# Patient Record
Sex: Female | Born: 1980 | Race: White | Hispanic: No | Marital: Married | Smoking: Current every day smoker
Health system: Southern US, Community
[De-identification: ages and names within clinical notes are randomized; demographics above are authoritative.]

## PROBLEM LIST (undated history)

## (undated) DIAGNOSIS — Z720 Tobacco use: Secondary | ICD-10-CM

## (undated) DIAGNOSIS — I7100 Dissection of unspecified site of aorta: Secondary | ICD-10-CM

## (undated) DIAGNOSIS — I10 Essential (primary) hypertension: Secondary | ICD-10-CM

## (undated) DIAGNOSIS — Z9889 Other specified postprocedural states: Secondary | ICD-10-CM

## (undated) DIAGNOSIS — D649 Anemia, unspecified: Secondary | ICD-10-CM

---

## 2015-04-19 ENCOUNTER — Emergency Department (HOSPITAL_COMMUNITY): Payer: Medicaid Other

## 2015-04-19 ENCOUNTER — Encounter (HOSPITAL_COMMUNITY): Payer: Self-pay | Admitting: Emergency Medicine

## 2015-04-19 ENCOUNTER — Inpatient Hospital Stay (HOSPITAL_COMMUNITY)
Admission: EM | Admit: 2015-04-19 | Discharge: 2015-04-26 | DRG: 220 | Disposition: A | Discharge status: Home / Self Care | Payer: Medicaid Other | Attending: Thoracic Surgery (Cardiothoracic Vascular Surgery) | Admitting: Thoracic Surgery (Cardiothoracic Vascular Surgery)

## 2015-04-19 DIAGNOSIS — R791 Abnormal coagulation profile: Secondary | ICD-10-CM

## 2015-04-19 DIAGNOSIS — R7989 Other specified abnormal findings of blood chemistry: Secondary | ICD-10-CM | POA: Insufficient documentation

## 2015-04-19 DIAGNOSIS — D6959 Other secondary thrombocytopenia: Secondary | ICD-10-CM | POA: Diagnosis present

## 2015-04-19 DIAGNOSIS — I252 Old myocardial infarction: Secondary | ICD-10-CM

## 2015-04-19 DIAGNOSIS — D62 Acute posthemorrhagic anemia: Secondary | ICD-10-CM | POA: Diagnosis not present

## 2015-04-19 DIAGNOSIS — Z720 Tobacco use: Secondary | ICD-10-CM | POA: Diagnosis present

## 2015-04-19 DIAGNOSIS — I712 Thoracic aortic aneurysm, without rupture: Secondary | ICD-10-CM | POA: Diagnosis present

## 2015-04-19 DIAGNOSIS — I351 Nonrheumatic aortic (valve) insufficiency: Secondary | ICD-10-CM | POA: Diagnosis present

## 2015-04-19 DIAGNOSIS — K089 Disorder of teeth and supporting structures, unspecified: Secondary | ICD-10-CM

## 2015-04-19 DIAGNOSIS — E8770 Fluid overload, unspecified: Secondary | ICD-10-CM | POA: Diagnosis not present

## 2015-04-19 DIAGNOSIS — I214 Non-ST elevation (NSTEMI) myocardial infarction: Secondary | ICD-10-CM | POA: Diagnosis present

## 2015-04-19 DIAGNOSIS — I313 Pericardial effusion (noninflammatory): Secondary | ICD-10-CM | POA: Diagnosis present

## 2015-04-19 DIAGNOSIS — J9589 Other postprocedural complications and disorders of respiratory system, not elsewhere classified: Secondary | ICD-10-CM | POA: Diagnosis not present

## 2015-04-19 DIAGNOSIS — F17210 Nicotine dependence, cigarettes, uncomplicated: Secondary | ICD-10-CM | POA: Diagnosis present

## 2015-04-19 DIAGNOSIS — Z8249 Family history of ischemic heart disease and other diseases of the circulatory system: Secondary | ICD-10-CM

## 2015-04-19 DIAGNOSIS — I7121 Aneurysm of the ascending aorta, without rupture: Secondary | ICD-10-CM | POA: Diagnosis present

## 2015-04-19 DIAGNOSIS — Z23 Encounter for immunization: Secondary | ICD-10-CM

## 2015-04-19 DIAGNOSIS — I7101 Dissection of thoracic aorta: Principal | ICD-10-CM | POA: Diagnosis present

## 2015-04-19 DIAGNOSIS — R42 Dizziness and giddiness: Secondary | ICD-10-CM | POA: Diagnosis not present

## 2015-04-19 DIAGNOSIS — K029 Dental caries, unspecified: Secondary | ICD-10-CM | POA: Diagnosis present

## 2015-04-19 DIAGNOSIS — K0530 Chronic periodontitis, unspecified: Secondary | ICD-10-CM | POA: Diagnosis present

## 2015-04-19 DIAGNOSIS — J9811 Atelectasis: Secondary | ICD-10-CM

## 2015-04-19 DIAGNOSIS — I10 Essential (primary) hypertension: Secondary | ICD-10-CM | POA: Diagnosis present

## 2015-04-19 DIAGNOSIS — I7100 Dissection of unspecified site of aorta: Secondary | ICD-10-CM

## 2015-04-19 DIAGNOSIS — R079 Chest pain, unspecified: Secondary | ICD-10-CM | POA: Diagnosis present

## 2015-04-19 DIAGNOSIS — D649 Anemia, unspecified: Secondary | ICD-10-CM | POA: Diagnosis present

## 2015-04-19 DIAGNOSIS — Z9889 Other specified postprocedural states: Secondary | ICD-10-CM

## 2015-04-19 HISTORY — DX: Tobacco use: Z72.0

## 2015-04-19 HISTORY — DX: Other specified postprocedural states: Z98.890

## 2015-04-19 HISTORY — DX: Dissection of unspecified site of aorta: I71.00

## 2015-04-19 HISTORY — DX: Essential (primary) hypertension: I10

## 2015-04-19 HISTORY — DX: Anemia, unspecified: D64.9

## 2015-04-19 LAB — BASIC METABOLIC PANEL
ANION GAP: 8 (ref 5–15)
BUN: 14 mg/dL (ref 6–20)
CALCIUM: 8.2 mg/dL — AB (ref 8.9–10.3)
CO2: 20 mmol/L — ABNORMAL LOW (ref 22–32)
Chloride: 113 mmol/L — ABNORMAL HIGH (ref 101–111)
Creatinine, Ser: 0.83 mg/dL (ref 0.44–1.00)
GFR calc Af Amer: >60 mL/min (ref >60)
GLUCOSE: 93 mg/dL (ref 65–99)
POTASSIUM: 4.3 mmol/L (ref 3.5–5.1)
SODIUM: 141 mmol/L (ref 135–145)

## 2015-04-19 LAB — TROPONIN I: TROPONIN I: 0.05 ng/mL — AB (ref <0.031)

## 2015-04-19 LAB — I-STAT BETA HCG BLOOD, ED (MC, WL, AP ONLY): I-stat hCG, quantitative: <5 m[IU]/mL (ref <5)

## 2015-04-19 LAB — CBC
HEMATOCRIT: 35.9 % — AB (ref 36.0–46.0)
HEMOGLOBIN: 11.6 g/dL — AB (ref 12.0–15.0)
MCH: 29.7 pg (ref 26.0–34.0)
MCHC: 32.3 g/dL (ref 30.0–36.0)
MCV: 92.1 fL (ref 78.0–100.0)
Platelets: 209 10*3/uL (ref 150–400)
RBC: 3.9 MIL/uL (ref 3.87–5.11)
RDW: 13.1 % (ref 11.5–15.5)
WBC: 11.1 10*3/uL — AB (ref 4.0–10.5)

## 2015-04-19 LAB — PROTIME-INR
INR: 1.14 (ref 0.00–1.49)
Prothrombin Time: 14.8 seconds (ref 11.6–15.2)

## 2015-04-19 LAB — I-STAT TROPONIN, ED: TROPONIN I, POC: 0 ng/mL (ref 0.00–0.08)

## 2015-04-19 LAB — APTT: APTT: 31 s (ref 24–37)

## 2015-04-19 LAB — D-DIMER, QUANTITATIVE (NOT AT ARMC): D DIMER QUANT: 6.35 ug{FEU}/mL — AB (ref 0.00–0.50)

## 2015-04-19 MED ORDER — IOHEXOL 350 MG/ML SOLN
100.0000 mL | Freq: Once | INTRAVENOUS | Status: AC | PRN
  Administered 2015-04-19: 100 mL via INTRAVENOUS

## 2015-04-19 MED ORDER — MORPHINE SULFATE (PF) 4 MG/ML IV SOLN
6.0000 mg | Freq: Once | INTRAVENOUS | Status: AC
  Administered 2015-04-19: 6 mg via INTRAVENOUS
  Filled 2015-04-19: qty 2

## 2015-04-19 MED ORDER — ONDANSETRON HCL 4 MG/2ML IJ SOLN
INTRAMUSCULAR | Status: AC
  Filled 2015-04-19: qty 2

## 2015-04-19 MED ORDER — ATORVASTATIN CALCIUM 40 MG PO TABS
40.0000 mg | ORAL_TABLET | Freq: Every day | ORAL | Status: DC
  Administered 2015-04-19: 40 mg via ORAL
  Filled 2015-04-19: qty 1

## 2015-04-19 MED ORDER — ASPIRIN EC 325 MG PO TBEC: 325.0000 mg | DELAYED_RELEASE_TABLET | Freq: Every day | ORAL | Status: DC

## 2015-04-19 MED ORDER — HEPARIN BOLUS VIA INFUSION
3500.0000 [IU] | Freq: Once | INTRAVENOUS | Status: AC
  Administered 2015-04-19: 3500 [IU] via INTRAVENOUS
  Filled 2015-04-19: qty 3500

## 2015-04-19 MED ORDER — LORAZEPAM 2 MG/ML IJ SOLN
0.5000 mg | Freq: Once | INTRAMUSCULAR | Status: AC
  Administered 2015-04-19: 0.5 mg via INTRAVENOUS
  Filled 2015-04-19: qty 1

## 2015-04-19 MED ORDER — HYDROMORPHONE HCL 1 MG/ML IJ SOLN
1.0000 mg | Freq: Once | INTRAMUSCULAR | Status: AC
  Administered 2015-04-19: 1 mg via INTRAVENOUS
  Filled 2015-04-19: qty 1

## 2015-04-19 MED ORDER — HEPARIN (PORCINE) IN NACL 100-0.45 UNIT/ML-% IJ SOLN
850.0000 [IU]/h | INTRAMUSCULAR | Status: DC
  Administered 2015-04-19: 700 [IU]/h via INTRAVENOUS
  Filled 2015-04-19: qty 250

## 2015-04-19 MED ORDER — ACETAMINOPHEN 325 MG PO TABS: 650.0000 mg | ORAL_TABLET | Freq: Four times a day (QID) | ORAL | Status: DC | PRN

## 2015-04-19 MED ORDER — ALUM & MAG HYDROXIDE-SIMETH 200-200-20 MG/5ML PO SUSP: 30.0000 mL | Freq: Four times a day (QID) | ORAL | Status: DC | PRN

## 2015-04-19 MED ORDER — NICOTINE 14 MG/24HR TD PT24
14.0000 mg | MEDICATED_PATCH | Freq: Every day | TRANSDERMAL | Status: DC
  Administered 2015-04-19: 14 mg via TRANSDERMAL
  Filled 2015-04-19: qty 1

## 2015-04-19 MED ORDER — SODIUM CHLORIDE 0.9 % IV SOLN: INTRAVENOUS | Status: DC

## 2015-04-19 MED ORDER — SODIUM CHLORIDE 0.9% FLUSH: 3.0000 mL | Freq: Two times a day (BID) | INTRAVENOUS | Status: DC

## 2015-04-19 MED ORDER — MORPHINE SULFATE (PF) 2 MG/ML IV SOLN
2.0000 mg | INTRAVENOUS | Status: DC | PRN
  Administered 2015-04-19: 2 mg via INTRAVENOUS
  Filled 2015-04-19: qty 1

## 2015-04-19 MED ORDER — HEPARIN SODIUM (PORCINE) 5000 UNIT/ML IJ SOLN: 5000.0000 [IU] | Freq: Three times a day (TID) | INTRAMUSCULAR | Status: DC

## 2015-04-19 MED ORDER — MORPHINE SULFATE (PF) 4 MG/ML IV SOLN
4.0000 mg | Freq: Once | INTRAVENOUS | Status: AC
  Administered 2015-04-19: 4 mg via INTRAVENOUS
  Filled 2015-04-19: qty 1

## 2015-04-19 MED ORDER — ONDANSETRON HCL 4 MG/2ML IJ SOLN
4.0000 mg | Freq: Once | INTRAMUSCULAR | Status: AC
Start: 2015-04-19 — End: 2015-04-19
  Administered 2015-04-19: 4 mg via INTRAVENOUS

## 2015-04-19 MED ORDER — ACETAMINOPHEN 650 MG RE SUPP: 650.0000 mg | Freq: Four times a day (QID) | RECTAL | Status: DC | PRN

## 2015-04-19 MED ORDER — LORAZEPAM 2 MG/ML IJ SOLN
1.0000 mg | Freq: Once | INTRAMUSCULAR | Status: AC
  Administered 2015-04-19: 1 mg via INTRAVENOUS

## 2015-04-19 NOTE — ED Notes (Signed)
Family at bedside. 

## 2015-04-19 NOTE — ED Notes (Signed)
Patient transported to CT 

## 2015-04-19 NOTE — ED Provider Notes (Signed)
CSN: [id number]     Arrival date & time 04/19/15  1437 History   First MD Initiated Contact with Patient 04/19/15 1503     Chief Complaint  Patient presents with  . Chest Pain     (Consider location/radiation/quality/duration/timing/severity/associated sxs/prior Treatment) Patient is a 35 y.o. female presenting with chest pain. The history is provided by the patient. The history is limited by a language barrier. A language interpreter was used ([place] transplator via telephone).  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 Immediate sharp chest pain, diaphor Relieved by:  Nothing Worsened by:  Deep breathing and certain positions Ineffective treatments:  None tried Associated symptoms: anxiety, cough, diaphoresis, nausea, shortness of breath and vomiting   Associated symptoms: no abdominal pain, no back pain, no dizziness, no dysphagia, no fatigue, no fever, no headache, no palpitations and no weakness     Past Medical History  Diagnosis Date  . Tobacco abuse    History reviewed. No pertinent past surgical history. Family History  Problem Relation Age of Onset  . Aortic aneurysm Mother   . Coronary artery disease Mother   . Thyroid disease Mother   . Varicose Veins Mother    Social History  Substance Use Topics  . Smoking status: Current Every Day Smoker -- 0.10 packs/day for 20 years    Types: Cigarettes  . Smokeless tobacco: None  . Alcohol Use: No   OB History    No data available     Review of Systems  Constitutional: Positive for diaphoresis. Negative for fever, chills, activity change, appetite change and fatigue.  HENT: Negative for facial  swelling, rhinorrhea, sore throat, trouble swallowing and voice change.   Eyes: Negative for photophobia, pain and visual disturbance.  Respiratory: Positive for cough, chest tightness and shortness of breath. Negative for apnea, choking, wheezing and stridor.   Cardiovascular: Positive for chest pain. Negative for palpitations and leg swelling.  Gastrointestinal: Positive for nausea and vomiting. Negative for abdominal pain, constipation and anal bleeding.  Endocrine: Negative.   Genitourinary: Negative for dysuria, vaginal bleeding, vaginal discharge and vaginal pain.  Musculoskeletal: Negative for myalgias, back pain and arthralgias.  Skin: Negative.  Negative for rash.  Allergic/Immunologic: Negative.   Neurological: Negative for dizziness, tremors, syncope, weakness and headaches.  Psychiatric/Behavioral: Negative for suicidal ideas, sleep disturbance and self-injury.  All other systems reviewed and are negative.     Allergies  Review of patient's allergies indicates no known allergies.  Home Medications   Prior to Admission medications   Medication Sig Start Date End Date Taking? Authorizing Provider  acetaminophen (TYLENOL) 500 MG tablet Take 500 mg by mouth every 6 (six) hours as needed for mild pain.   Yes Historical Provider, MD   BP 189/64 mmHg  Pulse 74  Temp(Src) 98.9 F (37.2 C) (Oral)  Resp 11  Ht 5\' 6"  (1.676 m)  Wt 59.421 kg  BMI 21.15 kg/m2  SpO2 100% Physical Exam  Constitutional: She is oriented to person, place, and time. She appears well-developed and well-nourished. No distress.  HENT:  Head: Normocephalic and atraumatic.  Right Ear: External ear normal.  Left Ear: External ear normal.  Mouth/Throat: Oropharynx  is clear and moist. No oropharyngeal exudate.  Eyes: Conjunctivae and EOM are normal. Pupils are equal, round, and reactive to light. No scleral icterus.  Neck: Normal range of motion. Neck supple. No JVD present. No tracheal deviation present.  No thyromegaly present.  Cardiovascular: Intact distal pulses.  Exam reveals no gallop and no friction rub.   No murmur heard. Regular rhythm iin high 50's during my exam  Pulmonary/Chest: Effort normal and breath sounds normal. No respiratory distress. She has no wheezes. She has no rales.  No stridor or respiratory distress on exam. Speaking in paragraphs. Handling oral secretions well.   Right lateral breast with palpable lump. No tenderness. No induration, fluctuance, or warmth to suggest acute infection.   Abdominal: Soft. Bowel sounds are normal. She exhibits no distension. There is no tenderness.  Musculoskeletal: Normal range of motion. She exhibits no edema or tenderness.  Neurological: She is alert and oriented to person, place, and time. No cranial nerve deficit. She exhibits normal muscle tone. Coordination normal.  Skin: Skin is warm and dry. She is not diaphoretic. No pallor.  Psychiatric: She has a normal mood and affect. She expresses no homicidal and no suicidal ideation. She expresses no suicidal plans and no homicidal plans.  Nursing note and vitals reviewed.   ED Course  Procedures (including critical care time) Labs Review Labs Reviewed  BASIC METABOLIC PANEL - Abnormal; Notable for the following:    Chloride 113 (*)    CO2 20 (*)    Calcium 8.2 (*)    All other components within normal limits  CBC - Abnormal; Notable for the following:    WBC 11.1 (*)    Hemoglobin 11.6 (*)    HCT 35.9 (*)    All other components within normal limits  D-DIMER, QUANTITATIVE (NOT AT [facility]) - Abnormal; Notable for the following:    D-Dimer, Quant 6.35 (*)    All other components within normal limits  TROPONIN I - Abnormal; Notable for the following:    Troponin I 0.05 (*)    All other components within normal limits  MRSA PCR SCREENING  PROTIME-INR  APTT  TROPONIN I  URINE RAPID DRUG SCREEN, HOSP PERFORMED  TROPONIN I  TROPONIN I  HEMOGLOBIN A1C  LIPID PANEL   COMPREHENSIVE METABOLIC PANEL  CBC  HEPARIN LEVEL (UNFRACTIONATED)  I-STAT TROPOININ, ED  I-STAT BETA HCG BLOOD, ED ([facility], [facility], [facility] ONLY)    Imaging Review Dg Chest 2 View  04/19/2015  CLINICAL DATA:  Shortness of breath, chest pain/tightness, nausea, blurred vision today. Recent flu-like symptoms. EXAM: CHEST  2 VIEW COMPARISON:  None. FINDINGS: Given the slight patient rotation, cardiomediastinal silhouette appears within normal limits in size and configuration. Heart size is normal. Lungs are clear. No pleural effusion. Osseous structures about the chest are unremarkable. IMPRESSION: Lungs are clear and there is no evidence of acute cardiopulmonary abnormality. No evidence of pneumonia. Electronically Signed   By: Bary Richard M.D.   On: 04/19/2015 15:23   Ct Angio Chest Pe W/cm &/or Wo Cm  04/19/2015  CLINICAL DATA:  Shortness of breath and chest pain EXAM: CT ANGIOGRAPHY CHEST WITH CONTRAST TECHNIQUE: Multidetector CT imaging of the chest was performed using the standard protocol during bolus administration of intravenous contrast. Multiplanar CT image reconstructions and MIPs were obtained to evaluate the vascular anatomy. CONTRAST:  OMNIPAQUE IOHEXOL 350 MG/ML SOLN COMPARISON:  Chest radiograph April 19, 2015 FINDINGS: Mediastinum/Lymph Nodes: There is no demonstrable pulmonary embolus. There is dilatation  of the proximal ascending thoracic aorta with a transverse diameter of 5.2 x 4.8 cm. The visualized great vessels appear unremarkable. Pericardium is not appreciably thickened. Thyroid appears unremarkable. There is no demonstrable thoracic adenopathy. Lungs/Pleura: There is slight scarring in the right lung base region. There are a few scattered small bullae in the upper lobes. There is upper and lower lobe bronchiectatic change. There is no parenchymal lung edema or consolidation. Upper abdomen: There is reflux of contrast into the aorta and hepatic veins. The visualized upper abdominal  structures otherwise appear unremarkable. Musculoskeletal: There are no blastic or lytic bone lesions. There is asymmetric breast tissue in the lateral right breast compared to the left. Review of the MIP images confirms the above findings. IMPRESSION: No demonstrable pulmonary embolus. There is dilatation of the ascending thoracic aorta with a transverse diameter of 5.2 x 4.8 cm. Ascending thoracic aortic aneurysm. Recommend semi-annual imaging followup by CTA or MRA and referral to cardiothoracic surgery if not already obtained. This recommendation follows 2010 ACCF/AHA/AATS/ACR/ASA/SCA/SCAI/SIR/STS/SVM Guidelines for the Diagnosis and Management of Patients With Thoracic Aortic Disease. Circulation. 2010; 121: Z610-R604. Suspect aortic stenosis given this degree of proximal ascending thoracic aortic dilatation. No edema or consolidation. Mild upper and lower lobe bronchiectatic change bilaterally. No adenopathy apparent. Reflux of contrast into the inferior vena cava and hepatic veins. Suspect a degree of increased right heart pressure. Asymmetric breast tissue lateral right breast compared to the left. This finding warrants physical examination. Consider breast ultrasound if palpable abnormality is noted in the lateral right breast. Electronically Signed   By: Bretta Bang III M.D.   On: 04/19/2015 18:39   I have personally reviewed and evaluated these images and lab results as part of my medical decision-making.   EKG Interpretation   Date/Time:  Friday April 19 2015 14:42:56 EDT Ventricular Rate:  58 PR Interval:  181 QRS Duration: 100 QT Interval:  458 QTC Calculation: 450 R Axis:   97 Text Interpretation:  Right and left arm electrode reversal,  interpretation assumes no reversal Sinus rhythm Probable lateral infarct,  old Confirmed by Ranae Palms  MD, DAVID ([id number]) on 04/19/2015 3:05:14 PM      MDM   Final diagnoses:  D-dimer, elevated    The patient is a 35 year old female who  denies any past medical history who presents via EMS with sudden onset chest pain after the patient accidentally inhaled Clorox while cleaning. EMS reports patient was hypotensive on their arrival however after getting fluids patient has blood pressure of 106/54 on arrival to the emergency department. EKG shows sinus rhythm. X-ray does not show any acute pathology. Troponin was drawn at 0 and 3 hours are both within normal limits. D-dimer significantly elevated and patient has pleuritic chest pain therefore CT chest is obtained. While this does not show a pulmonary embolism it does show a dilated thoracic aorta with no signs of acute dissection. An abnormality of the lateral right breast is also noted on the CT scan which is confirmed on physical exam. The patient is made aware of this as is the admitting team as there is a likely need for ultrasound follow-up. The patient has continued pain requiring multiple doses of IV pain medications. Given her abnormal CTA chest and continued pain she is admitted to the hospitalist service for further monitoring and evaluation. Cardiac thoracic surgeries also called and consult on the patient. Patient family expressed understanding and agreement with this plan.  Patient seen with attending, Dr. Ranae Palms, who oversaw clinical decision  making.   Lula Olszewski, MD 04/20/15 1610  Loren Racer, MD 04/24/15 [phone]
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
## 2015-04-19 NOTE — ED Notes (Signed)
Dr Fayrene FearingXiu updated on pt's BP and persistent CP

## 2015-04-19 NOTE — ED Notes (Signed)
Dr Fayrene FearingXiu updated on pt's trop

## 2015-04-19 NOTE — ED Notes (Signed)
Patient transported to X-ray 

## 2015-04-19 NOTE — ED Notes (Signed)
Pt and husband updated via translation line on D-dimer result and pending CTA

## 2015-04-19 NOTE — Progress Notes (Signed)
ANTICOAGULATION CONSULT NOTE - Initial Consult  Pharmacy Consult for Heparin Indication: chest pain/ACS  No Known Allergies  Patient Measurements: Height: 5' 6.93" (170 cm) Weight: 130 lb (58.968 kg) IBW/kg (Calculated) : 61.44 Heparin Dosing Weight: 59 kg  Vital Signs: Temp: 97.4 F (36.3 C) (03/17 1448) Temp Source: Oral (03/17 1448) BP: 164/63 mmHg (03/17 2115) Pulse Rate: 73 (03/17 2115)  Labs:  Recent Labs  04/19/15 1456  HGB 11.6*  HCT 35.9*  PLT 209  APTT 31  LABPROT 14.8  INR 1.14  CREATININE 0.83  TROPONINI 0.05*    CrCl cannot be calculated (Unknown ideal weight.).   Medical History: Past Medical History  Diagnosis Date  . Tobacco abuse     Medications:   (Not in a hospital admission) Scheduled:  . aspirin EC  325 mg Oral Daily  . atorvastatin  40 mg Oral q1800  . heparin  5,000 Units Subcutaneous 3 times per day  . nicotine  14 mg Transdermal Daily  . sodium chloride flush  3 mL Intravenous Q12H   Infusions:    Assessment: 35yo female with history of tobacco abuse presents with CP. Pharmacy is consulted to dose heparin for ACS/chest pain.  Goal of Therapy:  Heparin level 0.3-0.7 units/ml Monitor platelets by anticoagulation protocol: Yes   Plan:  Give 3500 units bolus x 1 Start heparin infusion at 700 units/hr Check anti-Xa level in 6 hours and daily while on heparin Continue to monitor H&H and platelets  Arlean Hoppingorey M. Newman PiesBall, PharmD, BCPS Clinical Pharmacist Pager [phone] 04/19/2015,10:04 PM

## 2015-04-19 NOTE — ED Notes (Signed)
Pt and family updated by Dr Ellin MayhewGoble via translation line

## 2015-04-19 NOTE — ED Notes (Signed)
From work for sub-sternal CP, initial BP 68/P, SB, with nausea and diaphoresis, non-English speaking 800ml NS  4mg  Zofran 324 ASA

## 2015-04-20 ENCOUNTER — Encounter (HOSPITAL_COMMUNITY): Payer: Self-pay | Admitting: Anesthesiology

## 2015-04-20 ENCOUNTER — Encounter (HOSPITAL_COMMUNITY): Payer: Self-pay | Admitting: *Deleted

## 2015-04-20 ENCOUNTER — Observation Stay (HOSPITAL_COMMUNITY): Payer: Medicaid Other | Admitting: Certified Registered Nurse Anesthetist

## 2015-04-20 ENCOUNTER — Inpatient Hospital Stay (HOSPITAL_COMMUNITY): Payer: Medicaid Other

## 2015-04-20 ENCOUNTER — Observation Stay (HOSPITAL_COMMUNITY): Payer: Medicaid Other

## 2015-04-20 ENCOUNTER — Encounter (HOSPITAL_COMMUNITY)
Admission: EM | Disposition: A | Discharge status: Home / Self Care | Payer: Self-pay | Source: Home / Self Care | Attending: Thoracic Surgery (Cardiothoracic Vascular Surgery)

## 2015-04-20 ENCOUNTER — Observation Stay (HOSPITAL_COMMUNITY): Payer: Self-pay

## 2015-04-20 DIAGNOSIS — E8770 Fluid overload, unspecified: Secondary | ICD-10-CM | POA: Diagnosis not present

## 2015-04-20 DIAGNOSIS — D62 Acute posthemorrhagic anemia: Secondary | ICD-10-CM | POA: Diagnosis not present

## 2015-04-20 DIAGNOSIS — I10 Essential (primary) hypertension: Secondary | ICD-10-CM

## 2015-04-20 DIAGNOSIS — I252 Old myocardial infarction: Secondary | ICD-10-CM | POA: Diagnosis not present

## 2015-04-20 DIAGNOSIS — R079 Chest pain, unspecified: Secondary | ICD-10-CM

## 2015-04-20 DIAGNOSIS — J9589 Other postprocedural complications and disorders of respiratory system, not elsewhere classified: Secondary | ICD-10-CM | POA: Diagnosis not present

## 2015-04-20 DIAGNOSIS — D649 Anemia, unspecified: Secondary | ICD-10-CM | POA: Diagnosis present

## 2015-04-20 DIAGNOSIS — Z8249 Family history of ischemic heart disease and other diseases of the circulatory system: Secondary | ICD-10-CM | POA: Diagnosis not present

## 2015-04-20 DIAGNOSIS — I712 Thoracic aortic aneurysm, without rupture: Secondary | ICD-10-CM

## 2015-04-20 DIAGNOSIS — R42 Dizziness and giddiness: Secondary | ICD-10-CM | POA: Diagnosis not present

## 2015-04-20 DIAGNOSIS — F17210 Nicotine dependence, cigarettes, uncomplicated: Secondary | ICD-10-CM | POA: Diagnosis present

## 2015-04-20 DIAGNOSIS — K029 Dental caries, unspecified: Secondary | ICD-10-CM | POA: Diagnosis present

## 2015-04-20 DIAGNOSIS — J9811 Atelectasis: Secondary | ICD-10-CM | POA: Diagnosis not present

## 2015-04-20 DIAGNOSIS — Z23 Encounter for immunization: Secondary | ICD-10-CM | POA: Diagnosis not present

## 2015-04-20 DIAGNOSIS — I351 Nonrheumatic aortic (valve) insufficiency: Secondary | ICD-10-CM | POA: Diagnosis present

## 2015-04-20 DIAGNOSIS — I7100 Dissection of unspecified site of aorta: Secondary | ICD-10-CM

## 2015-04-20 DIAGNOSIS — R791 Abnormal coagulation profile: Secondary | ICD-10-CM | POA: Diagnosis present

## 2015-04-20 DIAGNOSIS — I313 Pericardial effusion (noninflammatory): Secondary | ICD-10-CM | POA: Diagnosis present

## 2015-04-20 DIAGNOSIS — K0530 Chronic periodontitis, unspecified: Secondary | ICD-10-CM | POA: Diagnosis present

## 2015-04-20 DIAGNOSIS — Z720 Tobacco use: Secondary | ICD-10-CM

## 2015-04-20 DIAGNOSIS — I214 Non-ST elevation (NSTEMI) myocardial infarction: Secondary | ICD-10-CM

## 2015-04-20 DIAGNOSIS — Z9889 Other specified postprocedural states: Secondary | ICD-10-CM

## 2015-04-20 DIAGNOSIS — D6959 Other secondary thrombocytopenia: Secondary | ICD-10-CM | POA: Diagnosis present

## 2015-04-20 DIAGNOSIS — I719 Aortic aneurysm of unspecified site, without rupture: Secondary | ICD-10-CM

## 2015-04-20 DIAGNOSIS — I7101 Dissection of thoracic aorta: Secondary | ICD-10-CM | POA: Diagnosis present

## 2015-04-20 HISTORY — DX: Aortic aneurysm of unspecified site, without rupture: I71.9

## 2015-04-20 HISTORY — DX: Dissection of unspecified site of aorta: I71.00

## 2015-04-20 HISTORY — DX: Other specified postprocedural states: Z98.890

## 2015-04-20 HISTORY — PX: THORACIC AORTIC ANEURYSM REPAIR: SHX799

## 2015-04-20 HISTORY — PX: ASCENDING AORTIC ROOT REPLACEMENT: SHX5729

## 2015-04-20 LAB — CBC
HCT: 32.4 % — ABNORMAL LOW (ref 36.0–46.0)
HEMATOCRIT: 33.9 % — AB (ref 36.0–46.0)
HEMOGLOBIN: 10.9 g/dL — AB (ref 12.0–15.0)
Hemoglobin: 10.9 g/dL — ABNORMAL LOW (ref 12.0–15.0)
MCH: 29.7 pg (ref 26.0–34.0)
MCH: 30.8 pg (ref 26.0–34.0)
MCHC: 32.2 g/dL (ref 30.0–36.0)
MCHC: 33.6 g/dL (ref 30.0–36.0)
MCV: 92.4 fL (ref 78.0–100.0)
MCV: 91.5 fL (ref 78.0–100.0)
PLATELETS: 103 10*3/uL — AB (ref 150–400)
Platelets: 210 10*3/uL (ref 150–400)
RBC: 3.67 MIL/uL — ABNORMAL LOW (ref 3.87–5.11)
RBC: 3.54 MIL/uL — AB (ref 3.87–5.11)
RDW: 13.1 % (ref 11.5–15.5)
RDW: 13.7 % (ref 11.5–15.5)
WBC: 13.2 10*3/uL — ABNORMAL HIGH (ref 4.0–10.5)
WBC: 18.8 10*3/uL — ABNORMAL HIGH (ref 4.0–10.5)

## 2015-04-20 LAB — POCT I-STAT 3, ART BLOOD GAS (G3+)
ACID-BASE DEFICIT: 4 mmol/L — AB (ref 0.0–2.0)
ACID-BASE DEFICIT: 2 mmol/L (ref 0.0–2.0)
Acid-base deficit: 3 mmol/L — ABNORMAL HIGH (ref 0.0–2.0)
Acid-base deficit: 3 mmol/L — ABNORMAL HIGH (ref 0.0–2.0)
BICARBONATE: 21.6 meq/L (ref 20.0–24.0)
BICARBONATE: 23.4 meq/L (ref 20.0–24.0)
Bicarbonate: 21.9 mEq/L (ref 20.0–24.0)
Bicarbonate: 26.4 mEq/L — ABNORMAL HIGH (ref 20.0–24.0)
O2 SAT: 100 %
O2 SAT: 93 %
O2 SAT: 98 %
O2 Saturation: 100 %
PCO2 ART: 47.5 mmHg — AB (ref 35.0–45.0)
PH ART: 7.353 (ref 7.350–7.450)
PH ART: 7.238 — AB (ref 7.350–7.450)
PO2 ART: 79 mmHg — AB (ref 80.0–100.0)
PO2 ART: 120 mmHg — AB (ref 80.0–100.0)
Patient temperature: 36.4
TCO2: 23 mmol/L (ref 0–100)
TCO2: 23 mmol/L (ref 0–100)
TCO2: 28 mmol/L (ref 0–100)
TCO2: 25 mmol/L (ref 0–100)
pCO2 arterial: 39.5 mmHg (ref 35.0–45.0)
pCO2 arterial: 43.8 mmHg (ref 35.0–45.0)
pCO2 arterial: 61.5 mmHg (ref 35.0–45.0)
pH, Arterial: 7.302 — ABNORMAL LOW (ref 7.350–7.450)
pH, Arterial: 7.297 — ABNORMAL LOW (ref 7.350–7.450)
pO2, Arterial: 318 mmHg — ABNORMAL HIGH (ref 80.0–100.0)
pO2, Arterial: 418 mmHg — ABNORMAL HIGH (ref 80.0–100.0)

## 2015-04-20 LAB — POCT I-STAT, CHEM 8
BUN: 12 mg/dL (ref 6–20)
BUN: 10 mg/dL (ref 6–20)
BUN: 9 mg/dL (ref 6–20)
BUN: 10 mg/dL (ref 6–20)
BUN: 10 mg/dL (ref 6–20)
BUN: 11 mg/dL (ref 6–20)
BUN: 10 mg/dL (ref 6–20)
CALCIUM ION: 1.02 mmol/L — AB (ref 1.12–1.23)
CALCIUM ION: 1.03 mmol/L — AB (ref 1.12–1.23)
CALCIUM ION: 0.97 mmol/L — AB (ref 1.12–1.23)
CALCIUM ION: 1.01 mmol/L — AB (ref 1.12–1.23)
CREATININE: 0.2 mg/dL — AB (ref 0.44–1.00)
CREATININE: 0.3 mg/dL — AB (ref 0.44–1.00)
Calcium, Ion: 1.21 mmol/L (ref 1.12–1.23)
Calcium, Ion: 1.09 mmol/L — ABNORMAL LOW (ref 1.12–1.23)
Calcium, Ion: 1.24 mmol/L — ABNORMAL HIGH (ref 1.12–1.23)
Chloride: 107 mmol/L (ref 101–111)
Chloride: 105 mmol/L (ref 101–111)
Chloride: 106 mmol/L (ref 101–111)
Chloride: 99 mmol/L — ABNORMAL LOW (ref 101–111)
Chloride: 104 mmol/L (ref 101–111)
Chloride: 106 mmol/L (ref 101–111)
Chloride: 104 mmol/L (ref 101–111)
Creatinine, Ser: 0.4 mg/dL — ABNORMAL LOW (ref 0.44–1.00)
Creatinine, Ser: 0.3 mg/dL — ABNORMAL LOW (ref 0.44–1.00)
Creatinine, Ser: 0.3 mg/dL — ABNORMAL LOW (ref 0.44–1.00)
Creatinine, Ser: 0.4 mg/dL — ABNORMAL LOW (ref 0.44–1.00)
Creatinine, Ser: 0.3 mg/dL — ABNORMAL LOW (ref 0.44–1.00)
GLUCOSE: 141 mg/dL — AB (ref 65–99)
GLUCOSE: 200 mg/dL — AB (ref 65–99)
GLUCOSE: 216 mg/dL — AB (ref 65–99)
Glucose, Bld: 150 mg/dL — ABNORMAL HIGH (ref 65–99)
Glucose, Bld: 141 mg/dL — ABNORMAL HIGH (ref 65–99)
Glucose, Bld: 215 mg/dL — ABNORMAL HIGH (ref 65–99)
Glucose, Bld: 183 mg/dL — ABNORMAL HIGH (ref 65–99)
HCT: 29 % — ABNORMAL LOW (ref 36.0–46.0)
HCT: 24 % — ABNORMAL LOW (ref 36.0–46.0)
HCT: 27 % — ABNORMAL LOW (ref 36.0–46.0)
HCT: 22 % — ABNORMAL LOW (ref 36.0–46.0)
HCT: 24 % — ABNORMAL LOW (ref 36.0–46.0)
HEMATOCRIT: 20 % — AB (ref 36.0–46.0)
HEMATOCRIT: 28 % — AB (ref 36.0–46.0)
HEMOGLOBIN: 8.2 g/dL — AB (ref 12.0–15.0)
HEMOGLOBIN: 6.8 g/dL — AB (ref 12.0–15.0)
HEMOGLOBIN: 7.5 g/dL — AB (ref 12.0–15.0)
HEMOGLOBIN: 8.2 g/dL — AB (ref 12.0–15.0)
HEMOGLOBIN: 9.5 g/dL — AB (ref 12.0–15.0)
Hemoglobin: 9.9 g/dL — ABNORMAL LOW (ref 12.0–15.0)
Hemoglobin: 9.2 g/dL — ABNORMAL LOW (ref 12.0–15.0)
POTASSIUM: 5 mmol/L (ref 3.5–5.1)
POTASSIUM: 3.8 mmol/L (ref 3.5–5.1)
Potassium: 3.6 mmol/L (ref 3.5–5.1)
Potassium: 4.1 mmol/L (ref 3.5–5.1)
Potassium: 4.3 mmol/L (ref 3.5–5.1)
Potassium: 5.6 mmol/L — ABNORMAL HIGH (ref 3.5–5.1)
Potassium: 3.8 mmol/L (ref 3.5–5.1)
SODIUM: 136 mmol/L (ref 135–145)
SODIUM: 142 mmol/L (ref 135–145)
Sodium: 140 mmol/L (ref 135–145)
Sodium: 139 mmol/L (ref 135–145)
Sodium: 140 mmol/L (ref 135–145)
Sodium: 131 mmol/L — ABNORMAL LOW (ref 135–145)
Sodium: 137 mmol/L (ref 135–145)
TCO2: 21 mmol/L (ref 0–100)
TCO2: 22 mmol/L (ref 0–100)
TCO2: 23 mmol/L (ref 0–100)
TCO2: 21 mmol/L (ref 0–100)
TCO2: 23 mmol/L (ref 0–100)
TCO2: 24 mmol/L (ref 0–100)
TCO2: 24 mmol/L (ref 0–100)

## 2015-04-20 LAB — APTT: aPTT: 37 seconds (ref 24–37)

## 2015-04-20 LAB — COMPREHENSIVE METABOLIC PANEL
ALBUMIN: 2.9 g/dL — AB (ref 3.5–5.0)
ALT: 17 U/L (ref 14–54)
ANION GAP: 11 (ref 5–15)
AST: 17 U/L (ref 15–41)
Alkaline Phosphatase: 44 U/L (ref 38–126)
BUN: 13 mg/dL (ref 6–20)
CO2: 17 mmol/L — AB (ref 22–32)
Calcium: 8.5 mg/dL — ABNORMAL LOW (ref 8.9–10.3)
Chloride: 113 mmol/L — ABNORMAL HIGH (ref 101–111)
Creatinine, Ser: 0.61 mg/dL (ref 0.44–1.00)
GFR calc Af Amer: >60 mL/min (ref >60)
GFR calc non Af Amer: >60 mL/min (ref >60)
GLUCOSE: 147 mg/dL — AB (ref 65–99)
POTASSIUM: 3.9 mmol/L (ref 3.5–5.1)
SODIUM: 141 mmol/L (ref 135–145)
Total Bilirubin: 0.8 mg/dL (ref 0.3–1.2)
Total Protein: 5.3 g/dL — ABNORMAL LOW (ref 6.5–8.1)

## 2015-04-20 LAB — HEMOGLOBIN AND HEMATOCRIT, BLOOD
HEMATOCRIT: 25 % — AB (ref 36.0–46.0)
HEMOGLOBIN: 8.3 g/dL — AB (ref 12.0–15.0)

## 2015-04-20 LAB — GLUCOSE, CAPILLARY
GLUCOSE-CAPILLARY: 155 mg/dL — AB (ref 65–99)
GLUCOSE-CAPILLARY: 60 mg/dL — AB (ref 65–99)

## 2015-04-20 LAB — LIPID PANEL
CHOL/HDL RATIO: 3.1 ratio
Cholesterol: 136 mg/dL (ref 0–200)
HDL: 44 mg/dL (ref >40)
LDL Cholesterol: 85 mg/dL (ref 0–99)
Triglycerides: 37 mg/dL (ref <150)
VLDL: 7 mg/dL (ref 0–40)

## 2015-04-20 LAB — SURGICAL PCR SCREEN
MRSA, PCR: NEGATIVE
Staphylococcus aureus: NEGATIVE

## 2015-04-20 LAB — TROPONIN I
TROPONIN I: 0.03 ng/mL (ref <0.031)
Troponin I: 0.03 ng/mL (ref <0.031)

## 2015-04-20 LAB — POCT I-STAT 4, (NA,K, GLUC, HGB,HCT)
Glucose, Bld: 104 mg/dL — ABNORMAL HIGH (ref 65–99)
HCT: 33 % — ABNORMAL LOW (ref 36.0–46.0)
HEMOGLOBIN: 11.2 g/dL — AB (ref 12.0–15.0)
POTASSIUM: 4.1 mmol/L (ref 3.5–5.1)
SODIUM: 146 mmol/L — AB (ref 135–145)

## 2015-04-20 LAB — MRSA PCR SCREENING: MRSA BY PCR: NEGATIVE

## 2015-04-20 LAB — ABO/RH: ABO/RH(D): A POS

## 2015-04-20 LAB — HEPARIN LEVEL (UNFRACTIONATED): Heparin Unfractionated: 0.27 IU/mL — ABNORMAL LOW (ref 0.30–0.70)

## 2015-04-20 LAB — PROTIME-INR
INR: 1.59 — ABNORMAL HIGH (ref 0.00–1.49)
Prothrombin Time: 19 seconds — ABNORMAL HIGH (ref 11.6–15.2)

## 2015-04-20 LAB — FIBRINOGEN: Fibrinogen: 183 mg/dL — ABNORMAL LOW (ref 204–475)

## 2015-04-20 LAB — PLATELET COUNT: Platelets: 133 K/uL — ABNORMAL LOW (ref 150–400)

## 2015-04-20 LAB — PREPARE RBC (CROSSMATCH)

## 2015-04-20 SURGERY — ASCENDING AORTIC ROOT REPLACEMENT
Anesthesia: General | Site: Chest

## 2015-04-20 SURGERY — REPAIR, AORTIC DISSECTION, ASCENDING
Anesthesia: General

## 2015-04-20 MED ORDER — LACTATED RINGERS IV SOLN
INTRAVENOUS | Status: DC
  Administered 2015-04-20: 18:00:00 via INTRAVENOUS

## 2015-04-20 MED ORDER — CHLORHEXIDINE GLUCONATE 0.12% ORAL RINSE (MEDLINE KIT)
15.0000 mL | Freq: Two times a day (BID) | OROMUCOSAL | Status: DC
  Administered 2015-04-20 – 2015-04-22 (×3): 15 mL via OROMUCOSAL

## 2015-04-20 MED ORDER — SODIUM CHLORIDE 0.45 % IV SOLN
INTRAVENOUS | Status: DC | PRN
  Administered 2015-04-20: 18:00:00 via INTRAVENOUS

## 2015-04-20 MED ORDER — INSULIN ASPART 100 UNIT/ML ~~LOC~~ SOLN
0.0000 [IU] | SUBCUTANEOUS | Status: DC
  Administered 2015-04-21 – 2015-04-22 (×4): 2 [IU] via SUBCUTANEOUS

## 2015-04-20 MED ORDER — DOCUSATE SODIUM 100 MG PO CAPS
200.0000 mg | ORAL_CAPSULE | Freq: Every day | ORAL | Status: DC
  Administered 2015-04-21 – 2015-04-24 (×3): 200 mg via ORAL
  Filled 2015-04-20 (×6): qty 2

## 2015-04-20 MED ORDER — CHLORHEXIDINE GLUCONATE 0.12 % MT SOLN
15.0000 mL | OROMUCOSAL | Status: AC
  Administered 2015-04-20: 15 mL via OROMUCOSAL

## 2015-04-20 MED ORDER — ALBUMIN HUMAN 5 % IV SOLN
250.0000 mL | INTRAVENOUS | Status: AC | PRN
  Administered 2015-04-20 – 2015-04-21 (×4): 250 mL via INTRAVENOUS
  Filled 2015-04-20 (×2): qty 250

## 2015-04-20 MED ORDER — ACETAMINOPHEN 160 MG/5ML PO SOLN
1000.0000 mg | Freq: Four times a day (QID) | ORAL | Status: DC
  Administered 2015-04-21: 1000 mg
  Filled 2015-04-20: qty 40.6

## 2015-04-20 MED ORDER — MIDAZOLAM HCL 2 MG/2ML IJ SOLN
2.0000 mg | INTRAMUSCULAR | Status: DC | PRN
  Administered 2015-04-20: 2 mg via INTRAVENOUS
  Filled 2015-04-20: qty 2

## 2015-04-20 MED ORDER — VANCOMYCIN HCL 1000 MG IV SOLR
INTRAVENOUS | Status: AC
  Administered 2015-04-20: 1000 mL
  Filled 2015-04-20: qty 1000

## 2015-04-20 MED ORDER — ANTISEPTIC ORAL RINSE SOLUTION (CORINZ)
7.0000 mL | Freq: Four times a day (QID) | OROMUCOSAL | Status: DC
  Administered 2015-04-21 – 2015-04-23 (×6): 7 mL via OROMUCOSAL

## 2015-04-20 MED ORDER — ALBUMIN HUMAN 5 % IV SOLN
INTRAVENOUS | Status: DC | PRN
  Administered 2015-04-20 (×2): via INTRAVENOUS

## 2015-04-20 MED ORDER — ASPIRIN 81 MG PO CHEW: 324.0000 mg | CHEWABLE_TABLET | Freq: Every day | ORAL | Status: DC

## 2015-04-20 MED ORDER — MAGNESIUM SULFATE 4 GM/100ML IV SOLN
4.0000 g | Freq: Once | INTRAVENOUS | Status: AC
  Administered 2015-04-20: 4 g via INTRAVENOUS
  Filled 2015-04-20: qty 100

## 2015-04-20 MED ORDER — MIDAZOLAM HCL 5 MG/5ML IJ SOLN
INTRAMUSCULAR | Status: DC | PRN
  Administered 2015-04-20 (×2): 3 mg via INTRAVENOUS
  Administered 2015-04-20 (×2): 2 mg via INTRAVENOUS

## 2015-04-20 MED ORDER — VANCOMYCIN HCL 10 G IV SOLR
1250.0000 mg | INTRAVENOUS | Status: AC
Start: 2015-04-20 — End: 2015-04-20
  Administered 2015-04-20: 1250 mg via INTRAVENOUS
  Filled 2015-04-20: qty 1250

## 2015-04-20 MED ORDER — NITROGLYCERIN IN D5W 200-5 MCG/ML-% IV SOLN
2.0000 ug/min | INTRAVENOUS | Status: AC
  Administered 2015-04-20: 5 ug/min via INTRAVENOUS
  Filled 2015-04-20: qty 250

## 2015-04-20 MED ORDER — INSULIN REGULAR BOLUS VIA INFUSION
0.0000 [IU] | Freq: Three times a day (TID) | INTRAVENOUS | Status: DC
  Filled 2015-04-20: qty 10

## 2015-04-20 MED ORDER — METOPROLOL TARTRATE 25 MG/10 ML ORAL SUSPENSION: 12.5000 mg | Freq: Two times a day (BID) | ORAL | Status: DC

## 2015-04-20 MED ORDER — PHENYLEPHRINE HCL 10 MG/ML IJ SOLN
0.0000 ug/min | INTRAVENOUS | Status: DC
  Filled 2015-04-20: qty 2

## 2015-04-20 MED ORDER — HEPARIN SODIUM (PORCINE) 1000 UNIT/ML IJ SOLN
INTRAMUSCULAR | Status: DC | PRN
  Administered 2015-04-20: 20000 [IU] via INTRAVENOUS

## 2015-04-20 MED ORDER — SODIUM CHLORIDE 0.9 % IV SOLN
INTRAVENOUS | Status: DC
  Administered 2015-04-20: 18:00:00 via INTRAVENOUS

## 2015-04-20 MED ORDER — SODIUM CHLORIDE 0.9 % IV SOLN
INTRAVENOUS | Status: AC
  Administered 2015-04-20: 1.8 [IU]/h via INTRAVENOUS
  Filled 2015-04-20: qty 2.5

## 2015-04-20 MED ORDER — HEPARIN SODIUM (PORCINE) 1000 UNIT/ML IJ SOLN
INTRAMUSCULAR | Status: DC
  Filled 2015-04-20: qty 30

## 2015-04-20 MED ORDER — LACTATED RINGERS IV SOLN
INTRAVENOUS | Status: DC | PRN
  Administered 2015-04-20 (×2): via INTRAVENOUS

## 2015-04-20 MED ORDER — ROCURONIUM BROMIDE 100 MG/10ML IV SOLN
INTRAVENOUS | Status: DC | PRN
  Administered 2015-04-20: 50 mg via INTRAVENOUS

## 2015-04-20 MED ORDER — PHENYLEPHRINE HCL 10 MG/ML IJ SOLN
30.0000 ug/min | INTRAVENOUS | Status: DC
  Filled 2015-04-20: qty 2

## 2015-04-20 MED ORDER — SODIUM CHLORIDE 0.9 % IV SOLN: 250.0000 mL | INTRAVENOUS | Status: DC

## 2015-04-20 MED ORDER — ETOMIDATE 2 MG/ML IV SOLN
INTRAVENOUS | Status: DC | PRN
  Administered 2015-04-20: 20 mg via INTRAVENOUS

## 2015-04-20 MED ORDER — DEXTROSE 50 % IV SOLN
INTRAVENOUS | Status: AC
Start: 2015-04-20 — End: 2015-04-20
  Administered 2015-04-20: 16 mL
  Filled 2015-04-20: qty 50

## 2015-04-20 MED ORDER — POTASSIUM CHLORIDE 10 MEQ/50ML IV SOLN: 10.0000 meq | INTRAVENOUS | Status: AC

## 2015-04-20 MED ORDER — MIDAZOLAM HCL 10 MG/2ML IJ SOLN
INTRAMUSCULAR | Status: AC
  Filled 2015-04-20: qty 2

## 2015-04-20 MED ORDER — ONDANSETRON HCL 4 MG/2ML IJ SOLN
4.0000 mg | Freq: Four times a day (QID) | INTRAMUSCULAR | Status: DC | PRN
Start: 2015-04-20 — End: 2015-04-20
  Administered 2015-04-20: 4 mg via INTRAVENOUS

## 2015-04-20 MED ORDER — ACETAMINOPHEN 650 MG RE SUPP
650.0000 mg | Freq: Once | RECTAL | Status: AC
  Administered 2015-04-20: 650 mg via RECTAL

## 2015-04-20 MED ORDER — PROPOFOL 10 MG/ML IV BOLUS
INTRAVENOUS | Status: AC
  Filled 2015-04-20: qty 20

## 2015-04-20 MED ORDER — SODIUM CHLORIDE 0.9 % IV SOLN
INTRAVENOUS | Status: DC
  Filled 2015-04-20: qty 2.5

## 2015-04-20 MED ORDER — TRAMADOL HCL 50 MG PO TABS
50.0000 mg | ORAL_TABLET | ORAL | Status: DC | PRN
  Administered 2015-04-21: 50 mg via ORAL
  Administered 2015-04-23 – 2015-04-25 (×5): 100 mg via ORAL
  Filled 2015-04-20 (×5): qty 2
  Filled 2015-04-20: qty 1

## 2015-04-20 MED ORDER — OXYCODONE HCL 5 MG PO TABS
5.0000 mg | ORAL_TABLET | ORAL | Status: DC | PRN
  Administered 2015-04-21 (×3): 10 mg via ORAL
  Administered 2015-04-21 (×2): 5 mg via ORAL
  Administered 2015-04-22 – 2015-04-24 (×6): 10 mg via ORAL
  Filled 2015-04-20 (×3): qty 2
  Filled 2015-04-20: qty 1
  Filled 2015-04-20 (×5): qty 2
  Filled 2015-04-20: qty 1
  Filled 2015-04-20: qty 2

## 2015-04-20 MED ORDER — PLASMA-LYTE 148 IV SOLN
INTRAVENOUS | Status: AC
  Administered 2015-04-20: 500 mL
  Filled 2015-04-20: qty 2.5

## 2015-04-20 MED ORDER — HYDRALAZINE HCL 20 MG/ML IJ SOLN
5.0000 mg | INTRAMUSCULAR | Status: DC | PRN
  Administered 2015-04-20: 5 mg via INTRAVENOUS
  Filled 2015-04-20: qty 1

## 2015-04-20 MED ORDER — NITROGLYCERIN IN D5W 200-5 MCG/ML-% IV SOLN: 0.0000 ug/min | INTRAVENOUS | Status: DC

## 2015-04-20 MED ORDER — SODIUM CHLORIDE 0.9% FLUSH
3.0000 mL | Freq: Two times a day (BID) | INTRAVENOUS | Status: DC
  Administered 2015-04-21 – 2015-04-22 (×2): 3 mL via INTRAVENOUS

## 2015-04-20 MED ORDER — SODIUM CHLORIDE 0.9 % IV SOLN
Freq: Once | INTRAVENOUS | Status: AC
  Administered 2015-04-20: 20:00:00 via INTRAVENOUS

## 2015-04-20 MED ORDER — DEXTROSE 5 % IV SOLN
20.0000 mg | INTRAVENOUS | Status: DC | PRN
  Administered 2015-04-20: 15 ug/min via INTRAVENOUS

## 2015-04-20 MED ORDER — ONDANSETRON HCL 4 MG/2ML IJ SOLN
4.0000 mg | Freq: Four times a day (QID) | INTRAMUSCULAR | Status: DC | PRN
Start: 2015-04-20 — End: 2015-04-26
  Administered 2015-04-21 – 2015-04-23 (×5): 4 mg via INTRAVENOUS
  Filled 2015-04-20 (×5): qty 2

## 2015-04-20 MED ORDER — FENTANYL CITRATE (PF) 100 MCG/2ML IJ SOLN
INTRAMUSCULAR | Status: DC | PRN
  Administered 2015-04-20: 150 ug via INTRAVENOUS
  Administered 2015-04-20: 100 ug via INTRAVENOUS
  Administered 2015-04-20 (×2): 250 ug via INTRAVENOUS
  Administered 2015-04-20: 150 ug via INTRAVENOUS
  Administered 2015-04-20: 100 ug via INTRAVENOUS
  Administered 2015-04-20: 150 ug via INTRAVENOUS
  Administered 2015-04-20: 200 ug via INTRAVENOUS
  Administered 2015-04-20: 250 ug via INTRAVENOUS
  Administered 2015-04-20: 150 ug via INTRAVENOUS
  Administered 2015-04-20: 100 ug via INTRAVENOUS
  Administered 2015-04-20: 150 ug via INTRAVENOUS

## 2015-04-20 MED ORDER — PANTOPRAZOLE SODIUM 40 MG PO TBEC
40.0000 mg | DELAYED_RELEASE_TABLET | Freq: Every day | ORAL | Status: DC
  Administered 2015-04-22 – 2015-04-26 (×5): 40 mg via ORAL
  Filled 2015-04-20 (×7): qty 1

## 2015-04-20 MED ORDER — FENTANYL CITRATE (PF) 250 MCG/5ML IJ SOLN
INTRAMUSCULAR | Status: AC
  Filled 2015-04-20: qty 10

## 2015-04-20 MED ORDER — SODIUM CHLORIDE 0.9 % IV SOLN
INTRAVENOUS | Status: DC
  Filled 2015-04-20: qty 40

## 2015-04-20 MED ORDER — SODIUM CHLORIDE 0.9 % IV SOLN
INTRAVENOUS | Status: AC
  Administered 2015-04-20: 69.8 mL/h via INTRAVENOUS
  Filled 2015-04-20: qty 40

## 2015-04-20 MED ORDER — CHLORHEXIDINE GLUCONATE 0.12 % MT SOLN: 15.0000 mL | Freq: Once | OROMUCOSAL | Status: DC

## 2015-04-20 MED ORDER — BISACODYL 10 MG RE SUPP: 10.0000 mg | Freq: Every day | RECTAL | Status: DC

## 2015-04-20 MED ORDER — FENTANYL CITRATE (PF) 250 MCG/5ML IJ SOLN
INTRAMUSCULAR | Status: AC
  Filled 2015-04-20: qty 25

## 2015-04-20 MED ORDER — HEMOSTATIC AGENTS (NO CHARGE) OPTIME
TOPICAL | Status: DC | PRN
  Administered 2015-04-20: 1 via TOPICAL

## 2015-04-20 MED ORDER — SODIUM CHLORIDE 0.9% FLUSH: 3.0000 mL | INTRAVENOUS | Status: DC | PRN

## 2015-04-20 MED ORDER — SODIUM CHLORIDE 0.9 % IJ SOLN
INTRAMUSCULAR | Status: DC | PRN
  Administered 2015-04-20 (×5): 4 mL via TOPICAL

## 2015-04-20 MED ORDER — EPINEPHRINE HCL 1 MG/ML IJ SOLN
0.0000 ug/min | INTRAVENOUS | Status: DC
  Filled 2015-04-20: qty 4

## 2015-04-20 MED ORDER — ACETAMINOPHEN 500 MG PO TABS
1000.0000 mg | ORAL_TABLET | Freq: Four times a day (QID) | ORAL | Status: AC
  Administered 2015-04-21 – 2015-04-25 (×17): 1000 mg via ORAL
  Filled 2015-04-20 (×18): qty 2

## 2015-04-20 MED ORDER — POTASSIUM CHLORIDE 2 MEQ/ML IV SOLN
80.0000 meq | INTRAVENOUS | Status: DC
  Filled 2015-04-20: qty 40

## 2015-04-20 MED ORDER — DOPAMINE-DEXTROSE 3.2-5 MG/ML-% IV SOLN
0.0000 ug/kg/min | INTRAVENOUS | Status: DC
  Filled 2015-04-20: qty 250

## 2015-04-20 MED ORDER — MORPHINE SULFATE (PF) 2 MG/ML IV SOLN
2.0000 mg | INTRAVENOUS | Status: DC | PRN
  Administered 2015-04-21: 2 mg via INTRAVENOUS
  Filled 2015-04-20 (×2): qty 1

## 2015-04-20 MED ORDER — CHLORHEXIDINE GLUCONATE CLOTH 2 % EX PADS: 6.0000 | MEDICATED_PAD | Freq: Once | CUTANEOUS | Status: DC

## 2015-04-20 MED ORDER — PROTAMINE SULFATE 10 MG/ML IV SOLN
INTRAVENOUS | Status: DC | PRN
  Administered 2015-04-20: 10 mg via INTRAVENOUS
  Administered 2015-04-20: 190 mg via INTRAVENOUS

## 2015-04-20 MED ORDER — LIDOCAINE HCL (CARDIAC) 20 MG/ML IV SOLN
INTRAVENOUS | Status: DC | PRN
  Administered 2015-04-20 (×2): 50 mg via INTRAVENOUS

## 2015-04-20 MED ORDER — BISACODYL 5 MG PO TBEC
10.0000 mg | DELAYED_RELEASE_TABLET | Freq: Every day | ORAL | Status: DC
  Administered 2015-04-21 – 2015-04-24 (×3): 10 mg via ORAL
  Filled 2015-04-20 (×6): qty 2

## 2015-04-20 MED ORDER — ACETAMINOPHEN 160 MG/5ML PO SOLN: 650.0000 mg | Freq: Once | ORAL | Status: AC

## 2015-04-20 MED ORDER — LACTATED RINGERS IV SOLN
INTRAVENOUS | Status: DC | PRN
  Administered 2015-04-20 (×2): via INTRAVENOUS

## 2015-04-20 MED ORDER — PROPOFOL 10 MG/ML IV BOLUS
INTRAVENOUS | Status: DC | PRN
  Administered 2015-04-20 (×4): 100 mg via INTRAVENOUS
  Administered 2015-04-20: 200 mg via INTRAVENOUS

## 2015-04-20 MED ORDER — ASPIRIN EC 325 MG PO TBEC
325.0000 mg | DELAYED_RELEASE_TABLET | Freq: Every day | ORAL | Status: DC
  Administered 2015-04-21 – 2015-04-25 (×5): 325 mg via ORAL
  Filled 2015-04-20 (×5): qty 1

## 2015-04-20 MED ORDER — LACTATED RINGERS IV SOLN: 500.0000 mL | Freq: Once | INTRAVENOUS | Status: DC | PRN

## 2015-04-20 MED ORDER — INFLUENZA VAC SPLIT QUAD 0.5 ML IM SUSY: 0.5000 mL | PREFILLED_SYRINGE | INTRAMUSCULAR | Status: DC

## 2015-04-20 MED ORDER — CEFUROXIME SODIUM 750 MG IJ SOLR
750.0000 mg | INTRAMUSCULAR | Status: DC
  Filled 2015-04-20: qty 750

## 2015-04-20 MED ORDER — VECURONIUM BROMIDE 10 MG IV SOLR
INTRAVENOUS | Status: DC | PRN
Start: 2015-04-20 — End: 2015-04-20
  Administered 2015-04-20 (×5): 5 mg via INTRAVENOUS

## 2015-04-20 MED ORDER — DEXTROSE 5 % IV SOLN
1.5000 g | Freq: Two times a day (BID) | INTRAVENOUS | Status: AC
  Administered 2015-04-20 – 2015-04-22 (×4): 1.5 g via INTRAVENOUS
  Filled 2015-04-20 (×4): qty 1.5

## 2015-04-20 MED ORDER — MAGNESIUM SULFATE 50 % IJ SOLN
40.0000 meq | INTRAMUSCULAR | Status: DC
  Filled 2015-04-20: qty 10

## 2015-04-20 MED ORDER — METOPROLOL TARTRATE 12.5 MG HALF TABLET
12.5000 mg | ORAL_TABLET | Freq: Two times a day (BID) | ORAL | Status: DC
  Administered 2015-04-21: 12.5 mg via ORAL
  Filled 2015-04-20: qty 1

## 2015-04-20 MED ORDER — METOPROLOL TARTRATE 1 MG/ML IV SOLN
2.5000 mg | INTRAVENOUS | Status: DC | PRN
Start: 2015-04-20 — End: 2015-04-26

## 2015-04-20 MED ORDER — LACTATED RINGERS IV SOLN
INTRAVENOUS | Status: DC | PRN
  Administered 2015-04-20 (×2): via INTRAVENOUS

## 2015-04-20 MED ORDER — DEXTROSE 5 % IV SOLN
1.5000 g | INTRAVENOUS | Status: AC
  Administered 2015-04-20: 1.5 g via INTRAVENOUS
  Administered 2015-04-20: .75 g via INTRAVENOUS
  Filled 2015-04-20: qty 1.5

## 2015-04-20 MED ORDER — FENTANYL CITRATE (PF) 250 MCG/5ML IJ SOLN
INTRAMUSCULAR | Status: AC
  Filled 2015-04-20: qty 5

## 2015-04-20 MED ORDER — METHYLPREDNISOLONE SODIUM SUCC 125 MG IJ SOLR
INTRAMUSCULAR | Status: DC | PRN
  Administered 2015-04-20: 125 mg via INTRAVENOUS

## 2015-04-20 MED ORDER — 0.9 % SODIUM CHLORIDE (POUR BTL) OPTIME
TOPICAL | Status: DC | PRN
  Administered 2015-04-20: 5000 mL

## 2015-04-20 MED ORDER — MORPHINE SULFATE (PF) 2 MG/ML IV SOLN
1.0000 mg | INTRAVENOUS | Status: DC | PRN
  Administered 2015-04-20 – 2015-04-21 (×7): 2 mg via INTRAVENOUS
  Filled 2015-04-20 (×4): qty 1
  Filled 2015-04-20: qty 2
  Filled 2015-04-20: qty 1

## 2015-04-20 MED ORDER — FAMOTIDINE IN NACL 20-0.9 MG/50ML-% IV SOLN
20.0000 mg | Freq: Two times a day (BID) | INTRAVENOUS | Status: AC
  Administered 2015-04-20 – 2015-04-21 (×2): 20 mg via INTRAVENOUS
  Filled 2015-04-20: qty 50

## 2015-04-20 MED ORDER — IOHEXOL 350 MG/ML SOLN
80.0000 mL | Freq: Once | INTRAVENOUS | Status: AC | PRN
  Administered 2015-04-20: 100 mL via INTRAVENOUS

## 2015-04-20 MED ORDER — DEXMEDETOMIDINE HCL IN NACL 200 MCG/50ML IV SOLN
0.0000 ug/kg/h | INTRAVENOUS | Status: DC
  Administered 2015-04-20: 0.7 ug/kg/h via INTRAVENOUS
  Filled 2015-04-20: qty 50

## 2015-04-20 MED ORDER — DEXMEDETOMIDINE HCL IN NACL 400 MCG/100ML IV SOLN
0.1000 ug/kg/h | INTRAVENOUS | Status: AC
Start: 2015-04-20 — End: 2015-04-20
  Administered 2015-04-20: .3 ug/kg/h via INTRAVENOUS
  Filled 2015-04-20: qty 100

## 2015-04-20 MED ORDER — VANCOMYCIN HCL IN DEXTROSE 1-5 GM/200ML-% IV SOLN
1000.0000 mg | Freq: Once | INTRAVENOUS | Status: AC
  Administered 2015-04-20: 1000 mg via INTRAVENOUS
  Filled 2015-04-20: qty 200

## 2015-04-20 SURGICAL SUPPLY — 114 items
ADAPTER CARDIO PERF ANTE/RETRO (ADAPTER) ×3 IMPLANT
BAG DECANTER FOR FLEXI CONT (MISCELLANEOUS) ×3 IMPLANT
BENZOIN TINCTURE PRP APPL 2/3 (GAUZE/BANDAGES/DRESSINGS) ×3 IMPLANT
BLADE STERNUM SYSTEM 6 (BLADE) ×3 IMPLANT
BLADE SURG 11 STRL SS (BLADE) ×6 IMPLANT
BLADE SURG 15 STRL LF DISP TIS (BLADE) IMPLANT
BLADE SURG 15 STRL SS (BLADE)
BLADE SURG CLIPPER 3M 9600 (MISCELLANEOUS) ×3 IMPLANT
BLADE SURG ROTATE 9660 (MISCELLANEOUS) IMPLANT
BLOOD HAEMOCONCENTR 700 MIDI (MISCELLANEOUS) ×3 IMPLANT
CANISTER SUCTION 2500CC (MISCELLANEOUS) ×3 IMPLANT
CANNULA ARTERIAL 007325 (MISCELLANEOUS) IMPLANT
CANNULA ARTERIAL 18F 007308 (MISCELLANEOUS) IMPLANT
CANNULA ARTERIAL 20F L7309 (MISCELLANEOUS) IMPLANT
CANNULA ARTERIAL 24F 007311 (MISCELLANEOUS) IMPLANT
CANNULA GRAFT 8MMX50CM (Graft) ×3 IMPLANT
CANNULA GUNDRY RCSP 15FR (MISCELLANEOUS) ×6 IMPLANT
CANNULA SUMP PERICARDIAL (CANNULA) ×3 IMPLANT
CATH HEART VENT LEFT (CATHETERS) ×4 IMPLANT
CATH THORACIC 28FR (CATHETERS) IMPLANT
CATH THORACIC 36FR RT ANG (CATHETERS) IMPLANT
CAUTERY SURG HI TEMP FINE TIP (MISCELLANEOUS) ×3 IMPLANT
CLIP TI MEDIUM 6 (CLIP) IMPLANT
CLIP TI WIDE RED SMALL 24 (CLIP) IMPLANT
CLIP TI WIDE RED SMALL 6 (CLIP) IMPLANT
CONN 1/2X1/2X1/2  BEN (MISCELLANEOUS)
CONN 1/2X1/2X1/2 BEN (MISCELLANEOUS) IMPLANT
CONN ST 1/4X3/8  BEN (MISCELLANEOUS) ×2
CONN ST 1/4X3/8 BEN (MISCELLANEOUS) ×4 IMPLANT
CONN Y 3/8X3/8X3/8  BEN (MISCELLANEOUS)
CONN Y 3/8X3/8X3/8 BEN (MISCELLANEOUS) IMPLANT
CONT SPECI 4OZ STER CLIK (MISCELLANEOUS) ×3 IMPLANT
CRADLE DONUT ADULT HEAD (MISCELLANEOUS) IMPLANT
DERMABOND ADVANCED (GAUZE/BANDAGES/DRESSINGS) ×1
DERMABOND ADVANCED .7 DNX12 (GAUZE/BANDAGES/DRESSINGS) ×2 IMPLANT
DRAPE INCISE IOBAN 66X45 STRL (DRAPES) ×3 IMPLANT
DRSG AQUACEL AG ADV 3.5X14 (GAUZE/BANDAGES/DRESSINGS) ×3 IMPLANT
DRSG COVADERM 4X14 (GAUZE/BANDAGES/DRESSINGS) ×3 IMPLANT
ELECT REM PT RETURN 9FT ADLT (ELECTROSURGICAL) ×6
ELECTRODE REM PT RTRN 9FT ADLT (ELECTROSURGICAL) ×4 IMPLANT
FELT TEFLON 1X6 (MISCELLANEOUS) ×6 IMPLANT
GAUZE SPONGE 4X4 12PLY STRL (GAUZE/BANDAGES/DRESSINGS) ×6 IMPLANT
GLOVE BIO SURGEON STRL SZ 6 (GLOVE) IMPLANT
GLOVE BIO SURGEON STRL SZ 6.5 (GLOVE) ×30 IMPLANT
GLOVE BIO SURGEON STRL SZ7 (GLOVE) IMPLANT
GLOVE BIO SURGEON STRL SZ7.5 (GLOVE) IMPLANT
GLOVE BIOGEL PI IND STRL 6 (GLOVE) ×12 IMPLANT
GLOVE BIOGEL PI IND STRL 6.5 (GLOVE) ×20 IMPLANT
GLOVE BIOGEL PI INDICATOR 6 (GLOVE) ×6
GLOVE BIOGEL PI INDICATOR 6.5 (GLOVE) ×10
GOWN STRL REUS W/ TWL LRG LVL3 (GOWN DISPOSABLE) ×24 IMPLANT
GOWN STRL REUS W/TWL LRG LVL3 (GOWN DISPOSABLE) ×12
GRAFT GELWEAVE VALSALVA 26CM (Prosthesis & Implant Heart) ×3 IMPLANT
GRAFT WOVEN D/V 26DX30L (Vascular Products) ×3 IMPLANT
HANDLE STAPLE ENDO GIA SHORT (STAPLE) ×1
HEMOSTAT POWDER SURGIFOAM 1G (HEMOSTASIS) ×6 IMPLANT
INSERT FOGARTY SM (MISCELLANEOUS) IMPLANT
INSERT FOGARTY XLG (MISCELLANEOUS) ×3 IMPLANT
KIT BASIN OR (CUSTOM PROCEDURE TRAY) ×3 IMPLANT
KIT DRAINAGE VACCUM ASSIST (KITS) ×3 IMPLANT
KIT ROOM TURNOVER OR (KITS) ×3 IMPLANT
KIT SUCTION CATH 14FR (SUCTIONS) ×3 IMPLANT
LEAD PACING MYOCARDI (MISCELLANEOUS) ×3 IMPLANT
LINE VENT (MISCELLANEOUS) ×3 IMPLANT
LOOP VESSEL MAXI BLUE (MISCELLANEOUS) ×3 IMPLANT
LOOP VESSEL MINI RED (MISCELLANEOUS) ×3 IMPLANT
LOOP VESSEL SUPERMAXI WHITE (MISCELLANEOUS) ×6 IMPLANT
NEEDLE AORTIC AIR ASPIRATING (NEEDLE) IMPLANT
NS IRRIG 1000ML POUR BTL (IV SOLUTION) ×15 IMPLANT
PACK OPEN HEART (CUSTOM PROCEDURE TRAY) ×3 IMPLANT
PAD ARMBOARD 7.5X6 YLW CONV (MISCELLANEOUS) ×6 IMPLANT
PUNCH AORTIC ROTATE  4.5MM 8IN (MISCELLANEOUS) ×3 IMPLANT
RELOAD ENDO GIA 30 3.5 (STAPLE) ×3 IMPLANT
SEALANT SURG COSEAL 8ML (VASCULAR PRODUCTS) ×3 IMPLANT
SET CARDIOPLEGIA MPS 5001102 (MISCELLANEOUS) ×3 IMPLANT
SOLUTION ANTI FOG 6CC (MISCELLANEOUS) ×3 IMPLANT
SPONGE GAUZE 4X4 12PLY STER LF (GAUZE/BANDAGES/DRESSINGS) ×3 IMPLANT
SPONGE LAP 18X18 X RAY DECT (DISPOSABLE) IMPLANT
SPONGE LAP 4X18 X RAY DECT (DISPOSABLE) IMPLANT
STAPLER ENDO GIA 12MM SHORT (STAPLE) ×2 IMPLANT
SUT ETHIBON 2 0 V 52N 30 (SUTURE) ×6 IMPLANT
SUT MNCRL AB 3-0 PS2 18 (SUTURE) ×6 IMPLANT
SUT PDS AB 1 CTX 36 (SUTURE) ×6 IMPLANT
SUT PROLENE 3 0 RB 1 (SUTURE) IMPLANT
SUT PROLENE 3 0 SH DA (SUTURE) ×9 IMPLANT
SUT PROLENE 3 0 SH1 36 (SUTURE) ×21 IMPLANT
SUT PROLENE 4 0 RB 1 (SUTURE) ×13
SUT PROLENE 4 0 SH DA (SUTURE) ×6 IMPLANT
SUT PROLENE 4-0 RB1 .5 CRCL 36 (SUTURE) ×26 IMPLANT
SUT PROLENE 5 0 C 1 36 (SUTURE) ×24 IMPLANT
SUT PROLENE 6 0 C 1 30 (SUTURE) ×6 IMPLANT
SUT SILK 3 0 TIES 10X30 (SUTURE) ×3 IMPLANT
SUT STEEL STERNAL CCS#1 18IN (SUTURE) ×3 IMPLANT
SUT STEEL SZ 6 DBL 3X14 BALL (SUTURE) ×6 IMPLANT
SUT VIC AB 1 CTX 27 (SUTURE) IMPLANT
SUT VIC AB 1 CTX 36 (SUTURE) ×1
SUT VIC AB 1 CTX36XBRD ANBCTR (SUTURE) ×2 IMPLANT
SUT VIC AB 2-0 CTX 36 (SUTURE) ×9 IMPLANT
SUT VIC AB 3-0 SH 27 (SUTURE)
SUT VIC AB 3-0 SH 27X BRD (SUTURE) IMPLANT
SUT VIC AB 3-0 SH 8-18 (SUTURE) ×3 IMPLANT
SUT VIC AB 3-0 X1 27 (SUTURE) IMPLANT
SUT VICRYL 4-0 PS2 18IN ABS (SUTURE) IMPLANT
SYR 10ML KIT SKIN ADHESIVE (MISCELLANEOUS) ×3 IMPLANT
SYSTEM SAHARA CHEST DRAIN ATS (WOUND CARE) ×3 IMPLANT
SYSTEM SAHARA CHEST DRAIN RE-I (WOUND CARE) ×3 IMPLANT
TAPE CLOTH SURG 4X10 WHT LF (GAUZE/BANDAGES/DRESSINGS) ×3 IMPLANT
TAPE PAPER 3X10 WHT MICROPORE (GAUZE/BANDAGES/DRESSINGS) ×3 IMPLANT
TOWEL OR 17X24 6PK STRL BLUE (TOWEL DISPOSABLE) ×3 IMPLANT
TOWEL OR 17X26 10 PK STRL BLUE (TOWEL DISPOSABLE) ×6 IMPLANT
TRAY CATH LUMEN 1 20CM STRL (SET/KITS/TRAYS/PACK) IMPLANT
TUBE SUCT INTRACARD DLP 20F (MISCELLANEOUS) IMPLANT
VENT LEFT HEART 12002 (CATHETERS) ×6
WATER STERILE IRR 1000ML POUR (IV SOLUTION) ×6 IMPLANT

## 2015-04-20 NOTE — Progress Notes (Signed)
Consult request received by Dr. Isidoro Donningai for aortic dissection - patient has already been taken to the OR by Dr. Cornelius Moraswen. This seems to be a surgical case at this time. I'm happy to provide consultation for Dr. Cornelius Moraswen if requested. I will check back with the patient tomorrow morning.  Chrystie NoseKenneth C. Clydean Posas, MD, [facility]ACC Attending Cardiologist [facility]

## 2015-04-20 NOTE — Progress Notes (Signed)
CARDIOTHORACIC SURGERY BRIEF CONSULT NOTE   I was contacted by telephone last night and asked to see consult on this patient this morning because of dilated ascending aorta seen on CT angiogram.  I was explicitly told by ED provider that the patient's dilated aorta was felt to be unrelated to her presenting problem and there was no reason to see the patient in the ED last night.  I have just personally reviewed the CT angiogram performed last night and have concerns that the scan does not sufficiently rule out the possibility of aortic dissection involving the aortic root.  The contrast was timed for PE protocol and the aortic root is not well opacified.  Given the patient's clinical history I feel that the scan should be repeated STAT as a dedicated aortic protocol to r/o dissection.  This would best be done with a cardiac-gated scan to eliminate motion artifact, but a non-gated scan should be sufficient.  Will stop IV heparin until repeat CT angiogram has been done.  Patient will need an echocardiogram done at some point to r/o aortic valve pathology such as bicuspid aortic valve disease.  Full note to follow.  Purcell Nailslarence H Lashane Whelpley, MD 04/20/2015 7:25 AM

## 2015-04-20 NOTE — Progress Notes (Signed)
  Echocardiogram Echocardiogram Transesophageal has been performed.  Janalyn HarderWest, Petrina Melby R 04/20/2015, 10:41 AM

## 2015-04-20 NOTE — Progress Notes (Signed)
Shortly after patient switched to CPAP/PS increase in agitation noted. Heart rhythm went from NSR to a ventricular rhythm with a rate in the 100s-110s. Wean terminated, placed back on full support, and pain medication administered. Patient's father is at bedside, translating to patient, and explained to her why we are not continuing with wean. She nods that she understands. Will continue to assess for readiness to wean. Thresa RossA. Haggard RN

## 2015-04-20 NOTE — Anesthesia Postprocedure Evaluation (Signed)
Anesthesia Post Note  Patient: [name] BullaMelinda Tatum  Procedure(s) Performed: Procedure(s) (LRB): VALVE-SPARING AORTIC ROOT REPLACEMENT USING A 26MM GELWEAVE VALSALVA GRAFT AND REIMPLANTATION OF RIGHT AND LEFT CORONARY ARTERIES  (N/A) REPAIR OF AORTIC DISSECTION WITH A 26MM HEMASHIELD GRAFT  Patient location during evaluation: SICU Anesthesia Type: General Level of consciousness: sedated and patient remains intubated per anesthesia plan Pain management: pain level controlled Vital Signs Assessment: post-procedure vital signs reviewed and stable Respiratory status: patient remains intubated per anesthesia plan and patient on ventilator - see flowsheet for VS Cardiovascular status: blood pressure returned to baseline Anesthetic complications: no    Last Vitals:  Filed Vitals:   04/20/15 1830 04/20/15 1900  BP:    Pulse: 95   Temp: 36.2 C 36.3 C  Resp: 13     Last Pain:  Filed Vitals:   04/20/15 1902  PainSc: 6                  Henson Fraticelli A

## 2015-04-20 NOTE — Anesthesia Preprocedure Evaluation (Signed)
Anesthesia Evaluation  Patient identified by MRN, date of birth, ID band Patient awake  Preop documentation limited or incomplete due to emergent nature of procedure.  History of Anesthesia Complications Negative for: history of anesthetic complications  Airway Mallampati: I  TM Distance: >3 FB Neck ROM: Full    Dental  (+) Teeth Intact   Pulmonary Current Smoker,    breath sounds clear to auscultation       Cardiovascular + Past MI   Rhythm:Regular Rate:Normal     Neuro/Psych    GI/Hepatic   Endo/Other    Renal/GU      Musculoskeletal   Abdominal   Peds  Hematology   Anesthesia Other Findings Pt interviewed on OR bed prior to induction. Limited spoken [place].  Reproductive/Obstetrics                             Anesthesia Physical Anesthesia Plan  ASA: IV and emergent  Anesthesia Plan: General   Post-op Pain Management:    Induction: Intravenous  Airway Management Planned: Oral ETT  Additional Equipment: Arterial line, PA Cath, Ultrasound Guidance Line Placement and TEE  Intra-op Plan:   Post-operative Plan: Post-operative intubation/ventilation  Informed Consent: I have reviewed the patients History and Physical, chart, labs and discussed the procedure including the risks, benefits and alternatives for the proposed anesthesia with the patient or authorized representative who has indicated his/her understanding and acceptance.   History available from chart only and Only emergency history available  Plan Discussed with: CRNA, Anesthesiologist and Surgeon  Anesthesia Plan Comments:         Anesthesia Quick Evaluation

## 2015-04-20 NOTE — OR Nursing (Signed)
1st call made @1547  to SICU  2nd call made @1608  to SICU 3rd call made @1643  to SICU, confirming transition to Room 15 postoperatively. 4th call made @1716 , informed of transfer.

## 2015-04-20 NOTE — Progress Notes (Signed)
[facility]                                                                              Patient Demographics  [name] [name], is a 35 y.o. female, DOB - Nov 16, 1980, WUJ:[id number]  Admit date - 04/19/2015   Admitting Physician Lorretta Harp, MD  Outpatient Primary MD for the patient is No PCP Per Patient  LOS -   days    Chief Complaint  Patient presents with  . Chest Pain       Brief HPI   Patient is a 35 year old female with history of smoking presented with severe chest pain last night. Per admission history, she was cleaning with Clorox and subsequently started having chest pain. Patient's initial BP was low and in 60s, improved in ED. troponin slightly +0.05, WBC 11.1, afebrile, BMET CBC unremarkable CT angiogram of the chest was done with PE protocol showed no pulmonary embolism but dilatation of ascending thoracic aorta with transverse diameter of 5.2 x 4.8 cm.  Assessment & Plan    Principal Problem:   Aortic aneurysm with dissection (HCC) - Highly appreciate CT surgery, Dr Orvan July evaluation and management. Stat CTA chest with aortic protocol showed acute thoracic aortic dissection to the superior mesenteric artery. - Stat bedside echo done, results pending, patient was taken emergently to the OR by Dr Cornelius Moras - I had also called cardiology consult, spoke with Dr. Rennis Golden, patient may have underlying aortic disease, Dr. Rennis Golden with follow-up with Dr. Cornelius Moras after patient is back from surgery.  Active Problems:   Tobacco abuse - Nicotine patch  Incidental finding of asymmetric breast tissue lateral right breast compared to the left: -f/u with PCP, outpt work up  Code Status: full code  Family Communication: Discussed in detail with the patient, all imaging results, lab results explained to the patient. Dr Cornelius Moras discussed the management with patient and family  Disposition Plan: to OR   Time Spent in minutes   25 minutes  Procedures  CTA chest PE and aortic  protocol 2-D echo  Consults   CT surgery Cardiology, Dr. Rennis Golden  DVT Prophylaxis  patient was on heparin, which was discontinued  Medications  Scheduled Meds: . aminocaproic acid (AMICAR) for OHS   Intravenous To OR  . [MAR Hold] aspirin EC  325 mg Oral Daily  . [MAR Hold] atorvastatin  40 mg Oral q1800  . cefUROXime (ZINACEF)  IV  1.5 g Intravenous To OR  . cefUROXime (ZINACEF)  IV  750 mg Intravenous To OR  . [START ON 04/21/2015] chlorhexidine  15 mL Mouth/Throat Once  . Chlorhexidine Gluconate Cloth  6 each Topical Once  . dexmedetomidine  0.1-0.7 mcg/kg/hr Intravenous To OR  . DOPamine  0-10 mcg/kg/min Intravenous To OR  . epinephrine  0-10 mcg/min Intravenous To OR  . heparin-papaverine-plasmalyte irrigation   Irrigation To OR  . heparin 30,000 units/NS 1000 mL solution for CELLSAVER   Other To OR  . [MAR Hold] Influenza vac split quadrivalent PF  0.5 mL Intramuscular Tomorrow-1000  . insulin (NOVOLIN-R) infusion   Intravenous To OR  . magnesium sulfate  40 mEq Other To OR  . [  MAR Hold] nicotine  14 mg Transdermal Daily  . nitroGLYCERIN  2-200 mcg/min Intravenous To OR  . phenylephrine (NEO-SYNEPHRINE) Adult infusion  30-200 mcg/min Intravenous To OR  . potassium chloride  80 mEq Other To OR  . [MAR Hold] sodium chloride flush  3 mL Intravenous Q12H  . vancomycin 1000 mg in NS (1000 ml) irrigation for Dr. Cornelius Moras case   Irrigation To OR  . vancomycin  1,250 mg Intravenous To OR   Continuous Infusions: . sodium chloride Stopped (04/20/15 0739)   PRN Meds:.[MAR Hold] acetaminophen **OR** [MAR Hold] acetaminophen, [MAR Hold] alum & mag hydroxide-simeth, [MAR Hold] hydrALAZINE, [MAR Hold]  morphine injection, [MAR Hold] ondansetron (ZOFRAN) IV   Antibiotics   Anti-infectives    Start     Dose/Rate Route Frequency Ordered Stop   04/20/15 0930  vancomycin (VANCOCIN) 1,000 mg in sodium chloride 0.9 % 1,000 mL irrigation      Irrigation To Surgery 04/20/15 0924 04/21/15 0930    04/20/15 0915  vancomycin (VANCOCIN) 1,250 mg in sodium chloride 0.9 % 250 mL IVPB     1,250 mg 166.7 mL/hr over 90 Minutes Intravenous To Surgery 04/20/15 0902 04/21/15 0915   04/20/15 0915  cefUROXime (ZINACEF) 1.5 g in dextrose 5 % 50 mL IVPB     1.5 g 100 mL/hr over 30 Minutes Intravenous To Surgery 04/20/15 0902 04/21/15 0915   04/20/15 0900  cefUROXime (ZINACEF) 750 mg in dextrose 5 % 50 mL IVPB     750 mg 100 mL/hr over 30 Minutes Intravenous To Surgery 04/20/15 0856 04/21/15 0900        Subjective:   [name] [name] was seen and examined earlier this morning ~7:30am, complaining of chest pain, nausea, dyspnea. No fever or chills or any vomiting.   Objective:   Filed Vitals:   04/20/15 0540 04/20/15 0610 04/20/15 0640 04/20/15 0747  BP: 152/72 154/67 149/69 147/70  Pulse: 86 84 92 90  Temp:    98.9 F (37.2 C)  TempSrc:    Oral  Resp: 16 17 19 17   Height:      Weight:      SpO2: 100% 100% 100% 100%    Intake/Output Summary (Last 24 hours) at 04/20/15 1015 Last data filed at 04/20/15 0832  Gross per 24 hour  Intake      0 ml  Output    900 ml  Net   -900 ml     Wt Readings from Last 3 Encounters:  04/20/15 59.421 kg (131 lb)     Exam  General: Alert and oriented x 3, NAD, uncomfortable  HEENT:    Neck: Supple, no JVD  CVS: S1 S2 clear, Regular rate and rhythm.  Respiratory: Clear to auscultation bilaterally, no wheezing, rales or rhonchi  Abdomen: Soft, nontender, nondistended, + bowel sounds  Ext: no cyanosis clubbing or edema  Neuro: AAOx3, Cr N's II- XII. Strength 5/5 upper and lower extremities bilaterally  Skin: No rashes  Psych: anxious   Data Reviewed:  I have personally reviewed following labs and imaging studies  Micro Results Recent Results (from the past 240 hour(s))  MRSA PCR Screening     Status: None   Collection Time: 04/20/15 12:01 AM  Result Value Ref Range Status   MRSA by PCR NEGATIVE NEGATIVE Final    Comment:          The GeneXpert MRSA Assay (FDA approved for NASAL specimens only), is one component of a comprehensive MRSA colonization surveillance program. It is  not intended to diagnose MRSA infection nor to guide or monitor treatment for MRSA infections.     Radiology Reports Dg Chest 2 View  04/19/2015  CLINICAL DATA:  Shortness of breath, chest pain/tightness, nausea, blurred vision today. Recent flu-like symptoms. EXAM: CHEST  2 VIEW COMPARISON:  None. FINDINGS: Given the slight patient rotation, cardiomediastinal silhouette appears within normal limits in size and configuration. Heart size is normal. Lungs are clear. No pleural effusion. Osseous structures about the chest are unremarkable. IMPRESSION: Lungs are clear and there is no evidence of acute cardiopulmonary abnormality. No evidence of pneumonia. Electronically Signed   By: Bary RichardStan  Maynard M.D.   On: 04/19/2015 15:23   Ct Angio Chest Pe W/cm &/or Wo Cm  04/19/2015  CLINICAL DATA:  Shortness of breath and chest pain EXAM: CT ANGIOGRAPHY CHEST WITH CONTRAST TECHNIQUE: Multidetector CT imaging of the chest was performed using the standard protocol during bolus administration of intravenous contrast. Multiplanar CT image reconstructions and MIPs were obtained to evaluate the vascular anatomy. CONTRAST:  100mL OMNIPAQUE IOHEXOL 350 MG/ML SOLN COMPARISON:  Chest radiograph April 19, 2015 FINDINGS: Mediastinum/Lymph Nodes: There is no demonstrable pulmonary embolus. There is dilatation of the proximal ascending thoracic aorta with a transverse diameter of 5.2 x 4.8 cm. The visualized great vessels appear unremarkable. Pericardium is not appreciably thickened. Thyroid appears unremarkable. There is no demonstrable thoracic adenopathy. Lungs/Pleura: There is slight scarring in the right lung base region. There are a few scattered small bullae in the upper lobes. There is upper and lower lobe bronchiectatic change. There is no parenchymal lung edema or  consolidation. Upper abdomen: There is reflux of contrast into the aorta and hepatic veins. The visualized upper abdominal structures otherwise appear unremarkable. Musculoskeletal: There are no blastic or lytic bone lesions. There is asymmetric breast tissue in the lateral right breast compared to the left. Review of the MIP images confirms the above findings. IMPRESSION: No demonstrable pulmonary embolus. There is dilatation of the ascending thoracic aorta with a transverse diameter of 5.2 x 4.8 cm. Ascending thoracic aortic aneurysm. Recommend semi-annual imaging followup by CTA or MRA and referral to cardiothoracic surgery if not already obtained. This recommendation follows 2010 ACCF/AHA/AATS/ACR/ASA/SCA/SCAI/SIR/STS/SVM Guidelines for the Diagnosis and Management of Patients With Thoracic Aortic Disease. Circulation. 2010; 121: Z610-R604: [id number]. Suspect aortic stenosis given this degree of proximal ascending thoracic aortic dilatation. No edema or consolidation. Mild upper and lower lobe bronchiectatic change bilaterally. No adenopathy apparent. Reflux of contrast into the inferior vena cava and hepatic veins. Suspect a degree of increased right heart pressure. Asymmetric breast tissue lateral right breast compared to the left. This finding warrants physical examination. Consider breast ultrasound if palpable abnormality is noted in the lateral right breast. Electronically Signed   By: Bretta BangWilliam  Woodruff III M.D.   On: 04/19/2015 18:39   Ct Angio Chest Aorta W/cm &/or Wo/cm  04/20/2015  CLINICAL DATA:  Evaluate for dissection.  Chest pain. EXAM: CT ANGIOGRAPHY CHEST WITH CONTRAST TECHNIQUE: Multidetector CT imaging of the chest was performed using the standard protocol during bolus administration of intravenous contrast. Multiplanar CT image reconstructions and MIPs were obtained to evaluate the vascular anatomy. CONTRAST:  100mL OMNIPAQUE IOHEXOL 350 MG/ML SOLN COMPARISON:  CT scan April 19, 2015 FINDINGS: There  is a thoracic aortic dissection extending from the root of the aorta, at the level of the aortic valves, through the arch and descending thoracic aorta into the upper abodminal aorta. The remainder of the abdominal aorta was not included on  this study. There is significant motion near the heart making it difficult to confidently determine the true versus false lumen. However, I believe the true lumen is the more central smaller lumen. The dissection extends into the proximal right brachiocephalic artery and the left common carotid artery. The left common carotid artery is partially thrombosed and significantly narrowed in the neck. The dissection also extends into the proximal aspect of the left subclavian artery. The dissection also extended into the root of the superior mesenteric artery. I see no definitive extension into the celiac artery. The thoracic aorta is aneurysmal at its origin measuring 5.7 x 5.1 cm. It is difficult to evaluate the origins of the coronary arteries on this study. The study was not tailored to evaluate pulmonary arteries but no emboli are seen. No pleural effusions are noted. No active extravasation from the dissecting aorta is identified. However, there is high attenuation fluid in the mediastinum which is increased since yesterday consistent with mediastinal hematoma. No adenopathy. Prominence of the lateral right breast tissue as described yesterday could be further assessed with mammography. Secretions seen in right lower lobe airways. Central airways are normal. Bibasilar atelectasis is identified. No pulmonary nodules, masses, or focal infiltrates. Evaluation of the upper abdomen demonstrates no other abnormalities. Visualized bones are normal. Review of the MIP images confirms the above findings. IMPRESSION: 1. 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 An abdominal CTA was not performed to completely evaluate the  abdominal aorta. The dissection extends into the proximal right brachiocephalic artery, left common carotid artery, and left subclavian artery. It likely extends superiorly into the left common carotid artery which is significant narrowed with thrombus within the neck. The dissection also extends into the root of the superior mesenteric artery with apparent sparing of this celiac artery. The true lumen appears to be the smaller more central lumen and the vessels described above all appear to arise from the true lumen. 2. Increasing high attenuation fluid in the mediastinum consistent with hematoma. The findings were called by myself to Dr. Cornelius Moras at 8:45 a.m. Electronically Signed   By: Gerome Sam III M.D   On: 04/20/2015 09:10    CBC  Recent Labs Lab 04/19/15 1456 04/20/15 0335  WBC 11.1* 13.2*  HGB 11.6* 10.9*  HCT 35.9* 33.9*  PLT 209 210  MCV 92.1 92.4  MCH 29.7 29.7  MCHC 32.3 32.2  RDW 13.1 13.1    Chemistries   Recent Labs Lab 04/19/15 1456 04/20/15 0335  NA 141 141  K 4.3 3.9  CL 113* 113*  CO2 20* 17*  GLUCOSE 93 147*  BUN 14 13  CREATININE 0.83 0.61  CALCIUM 8.2* 8.5*  AST  --  17  ALT  --  17  ALKPHOS  --  44  BILITOT  --  0.8   ------------------------------------------------------------------------------------------------------------------ estimated creatinine clearance is 91.9 mL/min (by C-G formula based on Cr of 0.61). ------------------------------------------------------------------------------------------------------------------ No results for input(s): HGBA1C in the last 72 hours. ------------------------------------------------------------------------------------------------------------------  Recent Labs  04/20/15 0320  CHOL 136  HDL 44  LDLCALC 85  TRIG 37  CHOLHDL 3.1   ------------------------------------------------------------------------------------------------------------------ No results for input(s): TSH, T4TOTAL, T3FREE,  THYROIDAB in the last 72 hours.  Invalid input(s): FREET3 ------------------------------------------------------------------------------------------------------------------ No results for input(s): VITAMINB12, FOLATE, FERRITIN, TIBC, IRON, RETICCTPCT in the last 72 hours.  Coagulation profile  Recent Labs Lab 04/19/15 1456  INR 1.14     Recent Labs  04/19/15 1456  DDIMER 6.35*    Cardiac Enzymes  Recent Labs Lab 04/19/15 1456 04/19/15 2335 04/20/15 0335  TROPONINI 0.05* 0.03 0.03   ------------------------------------------------------------------------------------------------------------------ Invalid input(s): POCBNP   Recent Labs  04/20/15 0751  GLUCAP 155*     RAI,RIPUDEEP M.D. [facility] 04/20/2015, 10:15 AM  Pager: [phone] Between 7am to 7pm - call Pager - [phone]  After 7pm go to [email or web address] - password [email or web address]  Call night coverage person covering after 7pm

## 2015-04-20 NOTE — Brief Op Note (Addendum)
04/19/2015 - 04/20/2015  3:21 PM  PATIENT:  [name] BullaMelinda Zafar  35 y.o. female  PRE-OPERATIVE DIAGNOSIS:  ascending aneurysm, Aortic Dissection  POST-OPERATIVE DIAGNOSIS:  Ascending Aneurysm, Aortic Dissection  PROCEDURE:  Procedure(s):  MEDIAN STERNOTOMY  AXILLARY CANNULATION  REPAIR OF AORTIC DISSECTION -26 mm Hemashield Graft  VALVE SPARING ROOT REPLACEMENT -26 mm Gelweave Valsava Graft -Reimplantation of Right and Left Coronary Arteries  SURGEON:  Surgeon(s) and Role:    * Purcell Nailslarence H Joanmarie Tsang, MD - Primary  PHYSICIAN ASSISTANT: Lowella DandyErin Barrett PA-C  ASSISTANTS: Tanda Rockerserry Wagoner RNFA   ANESTHESIA:   general  EBL:  Total I/O In: 1000 [I.V.:1000] Out: 300 [Urine:300]  BLOOD ADMINISTERED:2U CC PRBC and  CELLSAVER  DRAINS: Mediastinal Chest Drains   LOCAL MEDICATIONS USED:  NONE  SPECIMEN:  Source of Specimen:  Ascending Aorta,   DISPOSITION OF SPECIMEN:  PATHOLOGY  COUNTS:  YES  TOURNIQUET:  * No tourniquets in log *  DICTATION: .Dragon Dictation  PLAN OF CARE: Admit to inpatient   PATIENT DISPOSITION:  ICU - intubated and hemodynamically stable.   Delay start of Pharmacological VTE agent (>24hrs) due to surgical blood loss or risk of bleeding: yes     Aortic Valve Etiology   Aortic Insufficiency:  Mild  Aortic Valve Disease:  Yes.  Aortic Stenosis:  No.  Etiology (Choose at least one and up to  5 etiologies):  Primary Aortic Disease, Aortic dissection and Primary Aortic Disease, Idiopathic Root Dilation   Aortic Valve  Procedure Performed:  Replacement: No.  Repair/Reconstruction: Yes.  Valve Sparing Root Replacement with Replacement of Ascending Aorta. Implant Model Number:[id number], Size:26, Unique Device [id number]:[phone]  Aortic Annular Enlargement: No.    Purcell Nailslarence H Desare Duddy, MD 04/20/2015 5:01 PM

## 2015-04-20 NOTE — Progress Notes (Signed)
ANTICOAGULATION CONSULT NOTE Pharmacy Consult for Heparin Indication: chest pain/ACS  No Known Allergies  Patient Measurements: Height: 5\' 6"  (167.6 cm) Weight: 131 lb (59.421 kg) IBW/kg (Calculated) : 59.3 Heparin Dosing Weight: 59 kg  Vital Signs: Temp: 98.9 F (37.2 C) (03/18 0008) Temp Source: Oral (03/18 0008) BP: 156/60 mmHg (03/18 0310) Pulse Rate: 78 (03/18 0310)  Labs:  Recent Labs  04/19/15 1456 04/19/15 2335 04/20/15 0320 04/20/15 0335  HGB 11.6*  --   --  10.9*  HCT 35.9*  --   --  33.9*  PLT 209  --   --  210  APTT 31  --   --   --   LABPROT 14.8  --   --   --   INR 1.14  --   --   --   HEPARINUNFRC  --   --  0.27*  --   CREATININE 0.83  --   --  0.61  TROPONINI 0.05* 0.03  --   --     Estimated Creatinine Clearance: 91.9 mL/min (by C-G formula based on Cr of 0.61).   Assessment: 35 y.o. female with chest pain for heparin   Goal of Therapy:  Heparin level 0.3-0.7 units/ml Monitor platelets by anticoagulation protocol: Yes   Plan:  Increase Heparin 850 units/hr Check heparin level in 6 hours.  Geannie RisenGreg Farran Amsden, PharmD, BCPS  04/20/2015,4:13 AM

## 2015-04-20 NOTE — Transfer of Care (Signed)
Immediate Anesthesia Transfer of Care Note  Patient: [name] [name]  Procedure(s) Performed: Procedure(s): VALVE-SPARING AORTIC ROOT REPLACEMENT USING A 26MM GELWEAVE VALSALVA GRAFT AND REIMPLANTATION OF RIGHT AND LEFT CORONARY ARTERIES  (N/A) REPAIR OF AORTIC DISSECTION WITH A 26MM HEMASHIELD GRAFT  Patient Location: SICU  Anesthesia Type:General  Level of Consciousness: Patient remains intubated per anesthesia plan  Airway & Oxygen Therapy: Patient remains intubated per anesthesia plan  Post-op Assessment: Report given to RN and Post -op Vital signs reviewed and stable  Post vital signs: Reviewed and stable  Last Vitals:  Filed Vitals:   04/20/15 0640 04/20/15 0747  BP: 149/69 147/70  Pulse: 92 90  Temp:  37.2 C  Resp: 19 17    Complications: No apparent anesthesia complications

## 2015-04-20 NOTE — Progress Notes (Signed)
RT NOTE:  Rapid Wean terminated. Following switch to CPAP/PS pt was waking up more, moving around but showing signs of pain/discomfort & anxiety. Pt became tachycardic (HR 110-116), RR increased 22-28, Sats 100%. Father remained @ bedside, he has done an amazing job attempting to calm patient. Due to patient not speaking/understand English this may be causing anxiety. Pt adjusted back to full support, RN administered pain medication and explained to father that we will allow patient to rest for a while and attempt again at later time tonight. Father remains at bedside.

## 2015-04-20 NOTE — Anesthesia Procedure Notes (Addendum)
Procedure Name: Intubation Date/Time: 04/20/2015 9:51 AM Performed by: Dairl PonderJIANG, Edessa Jakubowicz Pre-anesthesia Checklist: Patient identified, Timeout performed, Emergency Drugs available, Suction available and Patient being monitored Patient Re-evaluated:Patient Re-evaluated prior to inductionOxygen Delivery Method: Circle system utilized Preoxygenation: Pre-oxygenation with 100% oxygen Intubation Type: IV induction and Cricoid Pressure applied Ventilation: Mask ventilation without difficulty Laryngoscope Size: Mac and 3 Grade View: Grade I Tube type: Subglottic suction tube Tube size: 8.0 mm Number of attempts: 1 Airway Equipment and Method: Stylet Placement Confirmation: ETT inserted through vocal cords under direct vision,  breath sounds checked- equal and bilateral and positive ETCO2 Secured at: 22 cm Tube secured with: Tape Dental Injury: Teeth and Oropharynx as per pre-operative assessment       Right IJ with needle in place for eventual Swan Ganz catheter passage.

## 2015-04-20 NOTE — Progress Notes (Signed)
RT NOTE:  Rapid Wean started. RN and RT are communicating information with patients father at bedside. Father is translating instructions with patient. Pt is tolerating well. RN in room, RT outside room. RT monitoring.

## 2015-04-20 NOTE — Op Note (Addendum)
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  Normal caliber but acutely dissected distal ascending thoracic aorta and transverse arch  Normal left ventricular systolic function with mild left ventricular hypertrophy  Normal aortic valve function with trivial aortic insufficiency following successful Onalee Hua procedure repair                      BRIEF CLINICAL NOTE AND INDICATIONS FOR SURGERY  Patient is a 35 year old female from [place] with history of hypertension and long-standing tobacco abuse but otherwise unremarkable past medical history who was in her usual state of health until Yesterday afternoon when she developed sudden onset of severe substernal chest pain radiating to the neck. 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 She was cleaning using Clorox bleach at the time and thought perhaps she had inhaled too much fumes from the bleach. She presented to the emergency department where she was found to have positive troponin but EKG was sinus rhythm and no acute ischemic changes. CT angiogram of the chest was performed to rule out pulmonary embolus. It was reported to be negative for pulmonary embolus. The scan did reveal a 5.2 x 4.8 centimeter aneurysm of the proximal aorta. According to the interpreting radiologist there was no sign of aortic dissection. The patient was admitted to the hospitalist service, started on intravenous heparin and placed on protocol for presumed acute coronary syndrome. Elective cardiothoracic surgical consultation for the following morning was requested because of the presence of aneurysmal the proximal aorta.  Repeat CT angiogram of the aorta was performed, confirming the presence of acute type A aortic dissection.  The patient has been seen in consultation.  Using the patient's father to translate, the patient was counseled at length regarding the indications, risks and potential benefits of surgery.  All questions have been answered, and the patient provides full informed consent for the operation as described.    DETAILS OF THE OPERATIVE PROCEDURE  Preparation:  The patient is brought to the operating room on the above mentioned date and central monitoring was established by the anesthesia team including placement of Swan-Ganz catheter and left radial arterial line. The patient is placed in the supine position on the operating table.  Intravenous antibiotics are administered. General endotracheal anesthesia is induced uneventfully. A Foley catheter is placed.  Baseline transesophageal echocardiogram was performed.  Findings were notable for a  large aneurysm of the aortic root with obvious aortic dissection. The dissection flap could  be seen throughout the entire ascending and descending thoracic aorta. The aortic valve was tricuspid. There was mild central aortic insufficiency at baseline. There was normal left ventricular systolic function with mild left ventricular hypertrophy. There was a small pericardial effusion.  The patient's chest, abdomen, both groins, and both lower extremities are prepared and draped in a sterile manner. A time out procedure is performed.   Surgical Approach:  A small like incision is made in the right deltopectoral fossa. The incision is completed through the fascia and the pectoralis major muscle fibers are split longitudinally. The deep pectoralis fashion as incised. The right axillary artery is dissected away from associated structures using exclusively sharp dissection with care to avoid use of electrocautery. The right axillary artery appears normal with no sign of dissection.  A median sternotomy incision was performed.  The pericardium was opened. There is a small bloody pericardial effusion with acute blood throughout the pericardial space. There is acute hematoma in the anterior mediastinum. The ascending aorta is acutely dissected with obvious hematoma throughout the vessel.   Extracorporeal Cardiopulmonary Bypass and Myocardial Protection:  The patient is heparinized systemically. An 8 mm Gore-Tex graft with pre-attached cannula connector is sewn to the right axillary artery in end to side fashion to be utilized for arterial access.  A standard 2-stage venous cannula is placed through the right atrium.  Adequate heparinization is verified.   A retrograde cardioplegia cannula is placed through the right atrium into the coronary sinus.  The operative field was continuously flooded with carbon dioxide gas.  The entire pre-bypass portion of the operation was notable for stable hemodynamics.  Cardiopulmonary bypass was begun.  A left ventricular vent was placed through the right. Pulmonary  vein. A temperature probe was placed in the interventricular septum.  The patient is cooled to 26C systemic temperature.  The distal ascending aorta is dissected away from nearby structures. The innominate vein is dissected away from the aortic arch. The proximal innominate artery and left common carotid artery are dissected away from associated structures.  The aortic cross clamp is applied and cold blood cardioplegia is delivered retrograde through the coronary sinus catheter.  Iced saline slush is applied for topical hypothermia.  The initial cardioplegic arrest is rapid with early diastolic arrest.  Repeat doses of cardioplegia are administered intermittently throughout the entire cross clamp portion of the operation through the coronary sinus catheter in order to maintain completely flat electrocardiogram and septal myocardial temperature below 15C.  Myocardial protection was felt to be excellent.   Repair of Aortic Dissection:  The ascending aorta is transected several centimeters below the aortic cross-clamp. The aorta is obviously dissected. The ascending aorta proximal to the transection is dissected away from the pulmonary artery until the aortic root is exposed. The dissected aorta is transected again above the level of the left main and right coronary arteries. Aortic root is inspected. The patient has an obvious pre-existing aortic root aneurysm with a large entry tear for the dissection in the non-coronary sinus of Valsalva.  The entire route is aneurysmal but the aortic valve is tricuspid with only mild annular dilatation.  The patient is cooled to 26C temperature. The patient is placed in Trendelenburg position. High-dose steroids and intravenous anesthesia are administered. Cardioplegic bypass is temporarily discontinued and the cross-clamp removed and replaced on the proximal innominate artery. Low flow antegrade cerebral perfusion is immediately resumed, continuously  maintaining flow  of 500 mL/m.  The aortic arch is carefully examined. There is good back flow bleeding through the left common carotid artery. There is no dissection tear within the arch itself. The aortic arch is normal caliber, measuring only 24 cm in diameter.  Hemi-arch reconstruction is felt most appropriate.  The distal ascending thoracic aorta is replaced using a 26 mm Hemashield Platinum woven double velour vascular graft with the distal anastomosis sewn in an end to end fashion to the undersurface of the proximal aortic arch. The anastomosis is completed using running 3-0 Prolene suture with a Teflon felt strip to buttress the suture line. After completion of the distal anastomosis blood was allowed to flow backwards through the aortic graft and the cross-clamp replaced across the graft. Full flow cardiopulmonary bypass is resumed after a total duration of 18 minutes for low flow antegrade partial circulatory arrest. The distal anastomosis is carefully inspected and appears to be perfectly hemostatic.   Valve-Sparing Aortic Root Replacement Onalee Hua(David Procedure):  The Left main at the right coronary arteries are each mobilized off of the proximal aortic root on separate buttons of tissue. The remainder of the walls of the 3 sinuses of Valsalva are trimmed away leaving a 4 mm rim of tissue adjacent to the aortic valve annulus. 4-0 Prolene sutures are placed through the apex of each of the 3 commissures to suspend the valve. The aortic root is dissected away from the right ventricular outflow tract circumferentially. The aortic annulus sized to 26 mm. The aortic root is replaced using a 26 mm Gelweave Valsalva woven vascular graft.  The first several rings of the graft are trimmed.  The proximal suture line is constructed using interrupted horizontal mattress pledgeted 2-0 Ethibond sutures placed from within the left ventricular off a tract around the aortic valve circumferentially. The aortic valve is resuspended within  the proximal end of the aortic root graft.  4-0 Prolene sutures placed through the apex of each of the 3 commissures was then placed through the wall of the aortic root graft to resuspend the valve and orient it symmetrically. At this point the valve appears to be competent with saline testing. 4-0 Prolene sutures are then utilized to run the closure of the rim of sinus of Valsalva tissue circumferentially within the aortic root graft. The valve was again tested with saline and appears to be perfectly competent with symmetrical orientation of the 3 commissures and all 3 leaflets.  The left main and right coronary arteries are each reimplanted into the corresponding regions of the Valsalva portion of the aortic root graft using thermal cautery to create a circular holes in the graft for implantation.  At this juncture the distal end of the aortic root graft will not quite reach the proximal end of the ascending thoracic aortic graft. A short segment interposition portion of the 26 mm Hemashield Platinum graft is utilized in between the 2, with each end trimmed and beveled to an appropriate length. The distal anastomoses were each performed using running 4-0 Prolene suture.   Procedure Completion:  One final dose of warm retrograde "hot shot" cardioplegia was administered retrograde through the coronary sinus catheter while all air was evacuated through a small hole made on the anterior surface of the the aortic graft.  The aortic cross clamp was removed after a total cross clamp time of 166 minutes.  Epicardial pacing wires are fixed to the right ventricular outflow tract and to the right atrial appendage. The patient  is rewarmed to 37C temperature. The left ventricular vent is removed.  The patient is weaned and disconnected from cardiopulmonary bypass.  The patient's rhythm at separation from bypass was sinus.  The patient was weaned from cardioplegic bypass without any inotropic support. Total  cardiopulmonary bypass time for the operation was 207 minutes.  Followup transesophageal echocardiogram performed after separation from bypass revealed a normal functioning aortic valve. There is trivial central aortic insufficiency. Mean gradient across the aortic valve was estimated at 5 mmHg.  Left ventricular function was unchanged from preoperatively.    The venous cannula was removed uneventfully. Protamine was administered to reverse the anticoagulation. The right axillary artery graft was transected using a vascular stapler, leaving a very short stump close to the axillary artery.  The mediastinum and pleural space were inspected for hemostasis and irrigated with saline solution. The mediastinum and both pleural spaces were drained using 4 chest tubes placed through separate stab incisions inferiorly.  The soft tissues anterior to the aorta were reapproximated loosely. The sternum is closed with double strength sternal wire. The soft tissues anterior to the sternum were closed in multiple layers and the skin is closed with a running subcuticular skin closure.  The incision in the right deltopectoral groove is closed in multiple layers with absorbable suture and the skin incision closed using a running subcuticular skin closure.  The post-bypass portion of the operation was notable for stable rhythm and hemodynamics.  The patient received 2 units packed red blood cells during the procedure due to anemia which was present preoperatively and exacerbated by acute blood loss and hemodilution during cardiopulmonary bypass.   Disposition:  The patient tolerated the procedure well and is transported to the surgical intensive care in stable condition. There are no intraoperative complications. All sponge instrument and needle counts are verified correct at completion of the operation.    Salvatore Decent. Cornelius Moras MD 04/20/2015 5:02 PM

## 2015-04-20 NOTE — Consult Note (Signed)
[address]       [place] [address]             [phone]          CARDIOTHORACIC SURGERY CONSULTATION REPORT  PCP is No PCP Per Patient Referring Provider is Lorretta Harp, MD  Reason for consultation:  Ascending Thoracic Aortic Aneurysm  HPI:  Patient is a 35 year old female from [place] with history of hypertension and long-standing tobacco abuse but otherwise unremarkable past medical history who was in her usual state of health until Yesterday afternoon when she developed sudden onset of severe substernal chest pain radiating to the neck. 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  She was cleaning using Clorox bleach at the time and thought perhaps she had inhaled too much fumes from the bleach. She presented to the emergency department where she was found to have positive troponin but EKG was sinus rhythm and no acute ischemic changes. CT angiogram of the chest was performed to rule out pulmonary embolus. It was reported to be negative for pulmonary embolus. The scan did reveal a 5.2 x 4.8 centimeter aneurysm of the proximal aorta.  According to the interpreting radiologist there was no sign of aortic dissection. The patient was admitted to the hospitalist service, started on intravenous heparin and placed on protocol for presumed acute coronary syndrome. Elective cardiothoracic surgical consultation for the following morning was requested because of the presence of aneurysmal the proximal aorta.  The patient is married and recently moved from [place] to the [place] approximately 6 months ago. She does not speak any [place]. Her husband and father are present for consultation. The father speaks [place] and translates for the patient. The husband does not speak [place]. The patient describes a consistent story of acute onset severe substernal chest pain radiating to the neck that began yesterday afternoon. The pain has  persisted ever since and not dissipated.  The patient reportedly had no previous history of similar symptoms of chest discomfort in the past. She denies any previous history of exertional chest pain or shortness of breath. She has reported to be essentially healthy all of her life. She was evaluated by a physician once in the distant past because of a mild pectus carinatum deformity.  She has never had any surgery of any kind. She admits to known history of mild hypertension but has not been on medical therapy. She has long-standing history of tobacco abuse smoking at least 1 pack of cigarettes daily. She denies any known history of heart murmur in the past.  She denies any visual disturbances, headaches, numbness or weakness involving either upper or lower extremity.   Past Medical History  Diagnosis Date  . Tobacco abuse     History reviewed. No pertinent past surgical history.  Family History  Problem Relation Age of Onset  . Aortic aneurysm Mother   . Coronary artery disease Mother   . Thyroid disease Mother   . Varicose Veins Mother     Social History   Social History  . Marital Status: Married    Spouse Name: N/A  . Number of Children: N/A  . Years of Education: N/A   Occupational History  . Not on file.   Social History Main Topics  . Smoking status: Current Every Day Smoker -- 0.10 packs/day for 20 years    Types: Cigarettes  . Smokeless tobacco: Not on file  .  Alcohol Use: No  . Drug Use: Not on file  . Sexual Activity: Not on file   Other Topics Concern  . Not on file   Social History Narrative    Prior to Admission medications   Medication Sig Start Date End Date Taking? Authorizing Provider  acetaminophen (TYLENOL) 500 MG tablet Take 500 mg by mouth every 6 (six) hours as needed for mild pain.   Yes Historical Provider, MD    Current Facility-Administered Medications  Medication Dose Route Frequency Provider Last Rate Last Dose  . 0.9 %  sodium chloride  infusion   Intravenous Continuous Lorretta HarpXilin Niu, MD   Stopped at 04/20/15 [phone]  . acetaminophen (TYLENOL) tablet 650 mg  650 mg Oral Q6H PRN Lorretta HarpXilin Niu, MD       Or  . acetaminophen (TYLENOL) suppository 650 mg  650 mg Rectal Q6H PRN Lorretta HarpXilin Niu, MD      . alum & mag hydroxide-simeth (MAALOX/MYLANTA) 200-200-20 MG/5ML suspension 30 mL  30 mL Oral Q6H PRN Lorretta HarpXilin Niu, MD      . aminocaproic acid (AMICAR) 10 g in sodium chloride 0.9 % 100 mL infusion   Intravenous To OR Purcell Nailslarence H Owen, MD      . aspirin EC tablet 325 mg  325 mg Oral Daily Lorretta HarpXilin Niu, MD   325 mg at 04/19/15 2136  . atorvastatin (LIPITOR) tablet 40 mg  40 mg Oral q1800 Lorretta HarpXilin Niu, MD   40 mg at 04/19/15 2215  . cefUROXime (ZINACEF) 1.5 g in dextrose 5 % 50 mL IVPB  1.5 g Intravenous To OR Purcell Nailslarence H Owen, MD      . cefUROXime (ZINACEF) 750 mg in dextrose 5 % 50 mL IVPB  750 mg Intravenous To OR Purcell Nailslarence H Owen, MD      . Melene Muller[START ON 04/21/2015] chlorhexidine (PERIDEX) 0.12 % solution 15 mL  15 mL Mouth/Throat Once Purcell Nailslarence H Owen, MD      . Chlorhexidine Gluconate Cloth 2 % PADS 6 each  6 each Topical Once Purcell Nailslarence H Owen, MD      . dexmedetomidine (PRECEDEX) 400 MCG/100ML (4 mcg/mL) infusion  0.1-0.7 mcg/kg/hr Intravenous To OR Purcell Nailslarence H Owen, MD      . DOPamine (INTROPIN) 800 mg in dextrose 5 % 250 mL (3.2 mg/mL) infusion  0-10 mcg/kg/min Intravenous To OR Purcell Nailslarence H Owen, MD      . EPINEPHrine (ADRENALIN) 4 mg in dextrose 5 % 250 mL (0.016 mg/mL) infusion  0-10 mcg/min Intravenous To OR Purcell Nailslarence H Owen, MD      . heparin 2,500 Units, papaverine 30 mg in electrolyte-148 (PLASMALYTE-148) 500 mL irrigation   Irrigation To OR Purcell Nailslarence H Owen, MD      . heparin 30,000 units/NS 1000 mL solution for CELLSAVER   Other To OR Purcell Nailslarence H Owen, MD      . hydrALAZINE (APRESOLINE) injection 5 mg  5 mg Intravenous Q2H PRN Lorretta HarpXilin Niu, MD   5 mg at 04/20/15 0043  . [START ON 04/21/2015] Influenza vac split quadrivalent PF (FLUARIX) injection 0.5 mL  0.5 mL  Intramuscular Tomorrow-1000 Traci R Turner, MD      . insulin regular (NOVOLIN R,HUMULIN R) 250 Units in sodium chloride 0.9 % 250 mL (1 Units/mL) infusion   Intravenous To OR Purcell Nailslarence H Owen, MD      . magnesium sulfate (IV Push/IM) injection 40 mEq  40 mEq Other To OR Purcell Nailslarence H Owen, MD      . morphine 2 MG/ML injection 2  mg  2 mg Intravenous Q3H PRN Lorretta Harp, MD   2 mg at 04/19/15 2153  . nicotine (NICODERM CQ - dosed in mg/24 hours) patch 14 mg  14 mg Transdermal Daily Lorretta Harp, MD   14 mg at 04/19/15 2227  . nitroGLYCERIN 50 mg in dextrose 5 % 250 mL (0.2 mg/mL) infusion  2-200 mcg/min Intravenous To OR Purcell Nails, MD      . ondansetron [facility]) injection 4 mg  4 mg Intravenous Q6H PRN Jinger Neighbors, NP   4 mg at 04/20/15 0428  . phenylephrine (NEO-SYNEPHRINE) 20 mg in dextrose 5 % 250 mL (0.08 mg/mL) infusion  30-200 mcg/min Intravenous To OR Purcell Nails, MD      . potassium chloride injection 80 mEq  80 mEq Other To OR Purcell Nails, MD      . sodium chloride flush (NS) 0.9 % injection 3 mL  3 mL Intravenous Q12H Lorretta Harp, MD   3 mL at 04/19/15 2226  . vancomycin (VANCOCIN) 1,250 mg in sodium chloride 0.9 % 250 mL IVPB  1,250 mg Intravenous To OR Purcell Nails, MD        No Known Allergies    Review of Systems:   General:  normal appetite, normal energy, no weight gain, no weight loss, no fever  Cardiac:  no chest pain with exertion, + chest pain at rest, no SOB with exertion, no resting SOB, no PND, no orthopnea, no palpitations, no arrhythmia, no atrial fibrillation, no LE edema, no dizzy spells, no syncope  Respiratory:  no shortness of breath, no home oxygen, no productive cough, no dry cough, no bronchitis, no wheezing, no hemoptysis, no asthma, no pain with inspiration or cough, no sleep apnea, no CPAP at night  GI:   no difficulty swallowing, no reflux, no frequent heartburn, no hiatal hernia, no abdominal pain, no constipation, no diarrhea, no hematochezia, no  hematemesis, no melena  GU:   no dysuria,  no frequency, no urinary tract infection, no hematuria, no kidney stones, no kidney disease  Vascular:  no pain suggestive of claudication, no pain in feet, no leg cramps, no varicose veins, no DVT, no non-healing foot ulcer  Neuro:   no stroke, no TIA's, no seizures, no headaches, no temporary blindness one eye,  no slurred speech, no peripheral neuropathy, no chronic pain, no instability of gait, no memory/cognitive dysfunction  Musculoskeletal: no arthritis, no joint swelling, no myalgias, no difficulty walking, normal mobility   Skin:   no rash, no itching, no skin infections, no pressure sores or ulcerations  Psych:   no anxiety, no depression, + nervousness, no unusual recent stress  Eyes:   no blurry vision, no floaters, no recent vision changes, does not wear glasses or contacts  ENT:   no hearing loss, no loose or painful teeth, no dentures, last saw dentist many years ago  Hematologic:  no easy bruising, no abnormal bleeding, no clotting disorder, no frequent epistaxis  Endocrine:  no diabetes, does not check CBG's at home     Physical Exam:   BP 147/70 mmHg  Pulse 90  Temp(Src) 98.9 F (37.2 C) (Oral)  Resp 17  Ht 5\' 6"  (1.676 m)  Wt 131 lb (59.421 kg)  BMI 21.15 kg/m2  SpO2 100%  General:  Thin, anxious, but otherwise well-appearing  HEENT:  Unremarkable   Neck:   no JVD, no bruits, no adenopathy   Chest:   clear to auscultation, symmetrical breath sounds,  no wheezes, no rhonchi   CV:   RRR, no murmur   Abdomen:  soft, non-tender, no masses   Extremities:  warm, well-perfused, pulses diminished but palpable all 4 extremities, no lower extremity edema  Rectal/GU  Deferred  Neuro:   Grossly non-focal and symmetrical throughout  Skin:   Clean and dry, no rashes, no breakdown  Diagnostic Tests:  CT ANGIOGRAPHY CHEST WITH CONTRAST  TECHNIQUE: Multidetector CT imaging of the chest was performed using the standard protocol  during bolus administration of intravenous contrast. Multiplanar CT image reconstructions and MIPs were obtained to evaluate the vascular anatomy.  CONTRAST: OMNIPAQUE IOHEXOL 350 MG/ML SOLN  COMPARISON: Chest radiograph April 19, 2015  FINDINGS: Mediastinum/Lymph Nodes: There is no demonstrable pulmonary embolus. There is dilatation of the proximal ascending thoracic aorta with a transverse diameter of 5.2 x 4.8 cm. The visualized great vessels appear unremarkable. Pericardium is not appreciably thickened.  Thyroid appears unremarkable. There is no demonstrable thoracic adenopathy.  Lungs/Pleura: There is slight scarring in the right lung base region. There are a few scattered small bullae in the upper lobes. There is upper and lower lobe bronchiectatic change. There is no parenchymal lung edema or consolidation.  Upper abdomen: There is reflux of contrast into the aorta and hepatic veins. The visualized upper abdominal structures otherwise appear unremarkable.  Musculoskeletal: There are no blastic or lytic bone lesions. There is asymmetric breast tissue in the lateral right breast compared to the left.  Review of the MIP images confirms the above findings.  IMPRESSION: No demonstrable pulmonary embolus.  There is dilatation of the ascending thoracic aorta with a transverse diameter of 5.2 x 4.8 cm. Ascending thoracic aortic aneurysm. Recommend semi-annual imaging followup by CTA or MRA and referral to cardiothoracic surgery if not already obtained. This recommendation follows 2010 ACCF/AHA/AATS/ACR/ASA/SCA/SCAI/SIR/STS/SVM Guidelines for the Diagnosis and Management of Patients With Thoracic Aortic Disease. Circulation. 2010; 121: Z610-R604. Suspect aortic stenosis given this degree of proximal ascending thoracic aortic dilatation.  No edema or consolidation. Mild upper and lower lobe bronchiectatic change bilaterally. No adenopathy  apparent.  Reflux of contrast into the inferior vena cava and hepatic veins. Suspect a degree of increased right heart pressure.  Asymmetric breast tissue lateral right breast compared to the left. This finding warrants physical examination. Consider breast ultrasound if palpable abnormality is noted in the lateral right breast.   Electronically Signed  By: Bretta Bang III M.D.  On: 04/19/2015 18:39    CT ANGIOGRAPHY CHEST WITH CONTRAST  TECHNIQUE: Multidetector CT imaging of the chest was performed using the standard protocol during bolus administration of intravenous contrast. Multiplanar CT image reconstructions and MIPs were obtained to evaluate the vascular anatomy.  CONTRAST: OMNIPAQUE IOHEXOL 350 MG/ML SOLN  COMPARISON: CT scan April 19, 2015  FINDINGS: There is a thoracic aortic dissection extending from the root of the aorta, at the level of the aortic valves, through the arch and descending thoracic aorta into the upper abodminal aorta. The remainder of the abdominal aorta was not included on this study. There is significant motion near the heart making it difficult to confidently determine the true versus false lumen. However, I believe the true lumen is the more central smaller lumen. The dissection extends into the proximal right brachiocephalic artery and the left common carotid artery. The left common carotid artery is partially thrombosed and significantly narrowed in the neck. The dissection also extends into the proximal aspect of the left subclavian artery. The dissection also extended into  the root of the superior mesenteric artery. I see no definitive extension into the celiac artery. The thoracic aorta is aneurysmal at its origin measuring 5.7 x 5.1 cm. It is difficult to evaluate the origins of the coronary arteries on this study.  The study was not tailored to evaluate pulmonary arteries but no emboli are seen. No  pleural effusions are noted. No active extravasation from the dissecting aorta is identified. However, there is high attenuation fluid in the mediastinum which is increased since yesterday consistent with mediastinal hematoma. No adenopathy. Prominence of the lateral right breast tissue as described yesterday could be further assessed with mammography. Secretions seen in right lower lobe airways. Central airways are normal. Bibasilar atelectasis is identified. No pulmonary nodules, masses, or focal infiltrates.  Evaluation of the upper abdomen demonstrates no other abnormalities.  Visualized bones are normal.  Review of the MIP images confirms the above findings.  IMPRESSION: 1. 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 An abdominal CTA was not performed to completely evaluate the abdominal aorta. The dissection extends into the proximal right brachiocephalic artery, left common carotid artery, and left subclavian artery. It likely extends superiorly into the left common carotid artery which is significant narrowed with thrombus within the neck. The dissection also extends into the root of the superior mesenteric artery with apparent sparing of this celiac artery. The true lumen appears to be the smaller more central lumen and the vessels described above all appear to arise from the true lumen. 2. Increasing high attenuation fluid in the mediastinum consistent with hematoma. The findings were called by myself to Dr. Cornelius Moras at 8:45 a.m.   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  The patient's physical appearance and anatomical characteristics of the aortic root aneurysm might be consistent with some type of underlying collagen vascular disease such as Marfan's  syndrome.   Plan:  I have discussed the immediately life threatening nature of this problem with the patient and her husband using the patient's father to translate.  I provided a diagram illustrating the location of the aneurysm and explained the circumstances associated with acute aortic dissection. The need for emergency surgical repair has been discussed. The possibility that the patient's aortic valve may need to be replaced has been discussed. All other questions of been addressed. We plan to proceed directly to the operating room as soon as possible. They understand and accept all potential risks of surgery including but not limited to risk of death, stroke or other neurologic complication, myocardial infarction, congestive heart failure, respiratory failure, renal failure, bleeding requiring transfusion and/or reexploration, arrhythmia, infection or other wound complications, pneumonia, pleural and/or pericardial effusion, pulmonary embolus, other delayed complications related to aortic dissection, or delayed complications related to valve repair or replacement including but not limited to structural valve deterioration and failure, thrombosis, embolization, endocarditis, or paravalvular leak.  All of their questions have been answered.    I spent in excess of 120 minutes during the conduct of this hospital consultation and >50% of this time involved direct face-to-face encounter for counseling and/or coordination of the patient's care.    Salvatore Decent. Cornelius Moras, MD 04/20/2015 9:19 AM

## 2015-04-20 NOTE — Progress Notes (Signed)
[facility] BRIEF SICU PROGRESS NOTE  Day of Surgery  S/P Procedure(s) (LRB): VALVE-SPARING AORTIC ROOT REPLACEMENT USING A 26MM GELWEAVE VALSALVA GRAFT AND REIMPLANTATION OF RIGHT AND LEFT CORONARY ARTERIES  (N/A) REPAIR OF AORTIC DISSECTION WITH A 26MM HEMASHIELD GRAFT   Sedated on vent NSR w/ stable hemodynamics, no drips O2 sats 100% on 50% FiO2 Chest tube output low UOP excellent Labs okay w/ Hgb 10.9 but platelet count low 103k Coags pending  Plan: Continue routine early postop  [name] Nailslarence H Harding Thomure, MD 04/20/2015 6:50 PM

## 2015-04-21 ENCOUNTER — Inpatient Hospital Stay (HOSPITAL_COMMUNITY): Payer: Medicaid Other

## 2015-04-21 DIAGNOSIS — I10 Essential (primary) hypertension: Secondary | ICD-10-CM

## 2015-04-21 DIAGNOSIS — Z9889 Other specified postprocedural states: Secondary | ICD-10-CM

## 2015-04-21 LAB — POCT I-STAT 3, ART BLOOD GAS (G3+)
ACID-BASE DEFICIT: 2 mmol/L (ref 0.0–2.0)
ACID-BASE DEFICIT: 5 mmol/L — AB (ref 0.0–2.0)
Acid-base deficit: 3 mmol/L — ABNORMAL HIGH (ref 0.0–2.0)
BICARBONATE: 24.5 meq/L — AB (ref 20.0–24.0)
BICARBONATE: 20.3 meq/L (ref 20.0–24.0)
Bicarbonate: 24.1 mEq/L — ABNORMAL HIGH (ref 20.0–24.0)
Bicarbonate: 22.9 mEq/L (ref 20.0–24.0)
O2 SAT: 98 %
O2 SAT: 98 %
O2 Saturation: 98 %
O2 Saturation: 96 %
PCO2 ART: 43.1 mmHg (ref 35.0–45.0)
PH ART: 7.334 — AB (ref 7.350–7.450)
PH ART: 7.426 (ref 7.350–7.450)
Patient temperature: 37.4
Patient temperature: 37.7
TCO2: 25 mmol/L (ref 0–100)
TCO2: 24 mmol/L (ref 0–100)
TCO2: 26 mmol/L (ref 0–100)
TCO2: 21 mmol/L (ref 0–100)
pCO2 arterial: 45.6 mmHg — ABNORMAL HIGH (ref 35.0–45.0)
pCO2 arterial: 37.5 mmHg (ref 35.0–45.0)
pCO2 arterial: 39 mmHg (ref 35.0–45.0)
pH, Arterial: 7.329 — ABNORMAL LOW (ref 7.350–7.450)
pH, Arterial: 7.328 — ABNORMAL LOW (ref 7.350–7.450)
pO2, Arterial: 114 mmHg — ABNORMAL HIGH (ref 80.0–100.0)
pO2, Arterial: 117 mmHg — ABNORMAL HIGH (ref 80.0–100.0)
pO2, Arterial: 106 mmHg — ABNORMAL HIGH (ref 80.0–100.0)
pO2, Arterial: 90 mmHg (ref 80.0–100.0)

## 2015-04-21 LAB — CBC
HCT: 28.7 % — ABNORMAL LOW (ref 36.0–46.0)
HEMATOCRIT: 29.4 % — AB (ref 36.0–46.0)
Hemoglobin: 9.6 g/dL — ABNORMAL LOW (ref 12.0–15.0)
Hemoglobin: 9.4 g/dL — ABNORMAL LOW (ref 12.0–15.0)
MCH: 29.6 pg (ref 26.0–34.0)
MCH: 29.7 pg (ref 26.0–34.0)
MCHC: 32.7 g/dL (ref 30.0–36.0)
MCHC: 32.8 g/dL (ref 30.0–36.0)
MCV: 90.7 fL (ref 78.0–100.0)
MCV: 90.8 fL (ref 78.0–100.0)
Platelets: 104 10*3/uL — ABNORMAL LOW (ref 150–400)
Platelets: 119 10*3/uL — ABNORMAL LOW (ref 150–400)
RBC: 3.24 MIL/uL — AB (ref 3.87–5.11)
RBC: 3.16 MIL/uL — ABNORMAL LOW (ref 3.87–5.11)
RDW: 14 % (ref 11.5–15.5)
RDW: 14 % (ref 11.5–15.5)
WBC: 17.4 10*3/uL — AB (ref 4.0–10.5)
WBC: 19.8 10*3/uL — ABNORMAL HIGH (ref 4.0–10.5)

## 2015-04-21 LAB — PREPARE FRESH FROZEN PLASMA
Unit division: 0
Unit division: 0

## 2015-04-21 LAB — GLUCOSE, CAPILLARY
GLUCOSE-CAPILLARY: 103 mg/dL — AB (ref 65–99)
GLUCOSE-CAPILLARY: 119 mg/dL — AB (ref 65–99)
GLUCOSE-CAPILLARY: 137 mg/dL — AB (ref 65–99)
GLUCOSE-CAPILLARY: 151 mg/dL — AB (ref 65–99)
Glucose-Capillary: 92 mg/dL (ref 65–99)
Glucose-Capillary: 113 mg/dL — ABNORMAL HIGH (ref 65–99)
Glucose-Capillary: 117 mg/dL — ABNORMAL HIGH (ref 65–99)
Glucose-Capillary: 134 mg/dL — ABNORMAL HIGH (ref 65–99)

## 2015-04-21 LAB — BASIC METABOLIC PANEL
Anion gap: 7 (ref 5–15)
BUN: 14 mg/dL (ref 6–20)
CHLORIDE: 114 mmol/L — AB (ref 101–111)
CO2: 22 mmol/L (ref 22–32)
CREATININE: 0.57 mg/dL (ref 0.44–1.00)
Calcium: 7.4 mg/dL — ABNORMAL LOW (ref 8.9–10.3)
Glucose, Bld: 133 mg/dL — ABNORMAL HIGH (ref 65–99)
POTASSIUM: 4.2 mmol/L (ref 3.5–5.1)
SODIUM: 143 mmol/L (ref 135–145)

## 2015-04-21 LAB — PREPARE CRYOPRECIPITATE: UNIT DIVISION: 0

## 2015-04-21 LAB — CREATININE, SERUM
CREATININE: 0.66 mg/dL (ref 0.44–1.00)
GFR calc Af Amer: >60 mL/min (ref >60)

## 2015-04-21 LAB — POCT I-STAT, CHEM 8
BUN: 18 mg/dL (ref 6–20)
BUN: 20 mg/dL (ref 6–20)
CALCIUM ION: 1.09 mmol/L — AB (ref 1.12–1.23)
CHLORIDE: 104 mmol/L (ref 101–111)
Calcium, Ion: 1.12 mmol/L (ref 1.12–1.23)
Chloride: 108 mmol/L (ref 101–111)
Creatinine, Ser: 0.5 mg/dL (ref 0.44–1.00)
Creatinine, Ser: 0.6 mg/dL (ref 0.44–1.00)
Glucose, Bld: 152 mg/dL — ABNORMAL HIGH (ref 65–99)
Glucose, Bld: 132 mg/dL — ABNORMAL HIGH (ref 65–99)
HCT: 29 % — ABNORMAL LOW (ref 36.0–46.0)
HEMATOCRIT: 32 % — AB (ref 36.0–46.0)
HEMOGLOBIN: 10.9 g/dL — AB (ref 12.0–15.0)
HEMOGLOBIN: 9.9 g/dL — AB (ref 12.0–15.0)
POTASSIUM: 3.7 mmol/L (ref 3.5–5.1)
Potassium: 4.9 mmol/L (ref 3.5–5.1)
SODIUM: 140 mmol/L (ref 135–145)
SODIUM: 142 mmol/L (ref 135–145)
TCO2: 21 mmol/L (ref 0–100)
TCO2: 27 mmol/L (ref 0–100)

## 2015-04-21 LAB — PREPARE PLATELET PHERESIS: Unit division: 0

## 2015-04-21 LAB — MAGNESIUM
MAGNESIUM: 2.9 mg/dL — AB (ref 1.7–2.4)
MAGNESIUM: 2.6 mg/dL — AB (ref 1.7–2.4)

## 2015-04-21 MED ORDER — LEVALBUTEROL HCL 0.63 MG/3ML IN NEBU: 0.6300 mg | INHALATION_SOLUTION | Freq: Four times a day (QID) | RESPIRATORY_TRACT | Status: DC | PRN

## 2015-04-21 MED ORDER — ALBUMIN HUMAN 5 % IV SOLN
12.5000 g | INTRAVENOUS | Status: AC | PRN
  Administered 2015-04-21 (×2): 12.5 g via INTRAVENOUS
  Filled 2015-04-21 (×2): qty 250

## 2015-04-21 MED ORDER — FUROSEMIDE 10 MG/ML IJ SOLN
20.0000 mg | Freq: Once | INTRAMUSCULAR | Status: AC
  Administered 2015-04-21: 20 mg via INTRAVENOUS
  Filled 2015-04-21: qty 2

## 2015-04-21 MED ORDER — POTASSIUM CHLORIDE 10 MEQ/50ML IV SOLN
INTRAVENOUS | Status: AC
  Administered 2015-04-21: 10 meq
  Filled 2015-04-21: qty 150

## 2015-04-21 MED ORDER — LEVALBUTEROL HCL 0.63 MG/3ML IN NEBU
INHALATION_SOLUTION | RESPIRATORY_TRACT | Status: AC
  Administered 2015-04-21: 0.63 mg
  Filled 2015-04-21: qty 3

## 2015-04-21 MED ORDER — MORPHINE SULFATE (PF) 2 MG/ML IV SOLN
1.0000 mg | INTRAVENOUS | Status: DC | PRN
  Administered 2015-04-21 – 2015-04-22 (×7): 2 mg via INTRAVENOUS
  Filled 2015-04-21 (×7): qty 1

## 2015-04-21 MED ORDER — POTASSIUM CHLORIDE 10 MEQ/50ML IV SOLN
10.0000 meq | INTRAVENOUS | Status: AC
  Administered 2015-04-21 (×3): 10 meq via INTRAVENOUS

## 2015-04-21 NOTE — Progress Notes (Signed)
Spoke with Dr. Cornelius Moraswen regarding decreased urine output. Updated on overall status and that she is still intubated. Order received for 2 more albumins. Will continue to closely monitor. Thresa RossA. Jaceion Aday RN

## 2015-04-21 NOTE — Progress Notes (Signed)
RT NOTE:  RN called requesting to start Rapid Wean. Pt is becoming agitated wanting ETT out, she is awake and understands Wean.

## 2015-04-21 NOTE — Progress Notes (Signed)
RT NOTE:  Rapid Wean terminated. Pt maintained well throughout both stages of wean. ABG was within acceptable range. However, if patient is not being talked to constantly she has periods of apnea. RN witnessed and informed RT. Pt returned to full support. Will will attempt again @ 0530

## 2015-04-21 NOTE — Progress Notes (Addendum)
[address]       [place] [id number]             [phone]        CARDIOTHORACIC SURGERY PROGRESS NOTE   R1 Day Post-Op Procedure(s) (LRB): VALVE-SPARING AORTIC ROOT REPLACEMENT USING A 26MM GELWEAVE VALSALVA GRAFT AND REIMPLANTATION OF RIGHT AND LEFT CORONARY ARTERIES  (N/A) REPAIR OF AORTIC DISSECTION WITH A 26MM HEMASHIELD GRAFT  Subjective: Looks good.  Feels appropriately sore in chest, tired and weak  Objective: Vital signs: BP Readings from Last 1 Encounters:  04/21/15 100/63   Pulse Readings from Last 1 Encounters:  04/21/15 81   Resp Readings from Last 1 Encounters:  04/21/15 23   Temp Readings from Last 1 Encounters:  04/21/15 100.2 F (37.9 C)     Hemodynamics: PAP: (14-40)/(1-15) 21/4 mmHg CO:  [3 L/min-4.9 L/min] 4.8 L/min CI:  [1.8 L/min/m2-3 L/min/m2] 2.9 L/min/m2  Physical Exam:  Rhythm:   sinus  Breath sounds: clear  Heart sounds:  RRR w/out murmur  Incisions:  Dressing dry, intact  Abdomen:  Soft, non-distended, non-tender  Extremities:  Warm, well-perfused, palpable distal pulses  Chest tubes:  Decreasing volume thin serosanguinous output, no air leak    Intake/Output from previous day: 03/18 0701 - 03/19 0700 In: 9022.9 [I.V.:5095.9; Blood:1527; IV Piggyback:2400] Out: 5085 [Urine:2235; Emesis/NG output:200; Blood:1700; Chest Tube:950] Intake/Output this shift: Total I/O In: -  Out: 140 [Urine:20; Chest Tube:120]  Lab Results:  CBC: Recent Labs  04/20/15 1740 04/21/15 0008 04/21/15 0305  WBC 18.8*  --  17.4*  HGB 10.9* 9.9* 9.6*  HCT 32.4* 29.0* 29.4*  PLT 103*  --  104*    BMET:  Recent Labs  04/20/15 0335  04/21/15 0008 04/21/15 0305  NA 141  < > 142 143  K 3.9  < > 4.9 4.2  CL 113*  < > 108 114*  CO2 17*  --   --  22  GLUCOSE 147*  < > 152* 133*  BUN 13  < > 18 14  CREATININE 0.61  < > 0.50 0.57  CALCIUM 8.5*  --   --  7.4*  < > = values in this interval not displayed.   PT/INR:     Recent Labs  04/20/15 1740  LABPROT 19.0*  INR 1.59*    CBG (last 3)   Recent Labs  04/21/15 0005 04/21/15 0334 04/21/15 0716  GLUCAP 151* 113* 117*    ABG    Component Value Date/Time   PHART 7.328* 04/21/2015 0637   PCO2ART 39.0 04/21/2015 0637   PO2ART 90.0 04/21/2015 0637   HCO3 20.3 04/21/2015 0637   TCO2 21 04/21/2015 0637   ACIDBASEDEF 5.0* 04/21/2015 0637   O2SAT 96.0 04/21/2015 0637    CXR: PORTABLE CHEST 1 VIEW  COMPARISON: April 20, 2015  FINDINGS: Endotracheal tube tip is 5.0 cm above carina. Swan-Ganz catheter tip is in the main pulmonary outflow tract near the pulmonic valve. There is a chest tube with tip and side-port in the stomach. There is a left chest tube, a right chest tube, and a mediastinal drain. There is a small left apical pneumothorax without tension component. There is atelectatic change in the left base. There is a minimal right effusion. Lungs elsewhere clear. Heart size is normal. Aorta is prominent but stable.  IMPRESSION: Open catheter positions as described without pneumothorax. Small left apical pneumothorax without tension component. Left lower lobe atelectasis. Rather minimal right pleural effusion. No  change in cardiac silhouette.   Electronically Signed  By: Bretta Bang III M.D.  On: 04/21/2015 07:58  EKG: NSR w/out acute ischemic changes     Assessment/Plan: S/P Procedure(s) (LRB): VALVE-SPARING AORTIC ROOT REPLACEMENT USING A GELWEAVE VALSALVA GRAFT AND REIMPLANTATION OF RIGHT AND LEFT CORONARY ARTERIES  (N/A) REPAIR OF AORTIC DISSECTION WITH A HEMASHIELD GRAFT  Doing very well POD1 Maintaining NSR w/ stable hemodynamics, no drips Extubated uneventfully, breathing comfortably w/ O2 sats 98% on 3 L/min via [place] Expected post op acute blood loss anemia, stable Expected post op atelectasis, mild Post op thrombocytopenia, mild, stable Hypertension Tobacco  abuse   Mobilize  Diuresis  D/C swan  Continue nebs and encourage pulm toilet  Leave chest tubes in for now   Purcell Nails, MD 04/21/2015 9:05 AM

## 2015-04-21 NOTE — Progress Notes (Signed)
RT NOTE:  Rapid Wean started 

## 2015-04-21 NOTE — Progress Notes (Signed)
During CPAP/PS portion of wean patient having intermittent episodes of apnea if her father is not constantly stimulating her. Asked her father to not stimulate her for a few minutes to observe readiness to extubate. While her father was not talking to her, or touching her arm, she immediately went apneic, or had a respiratory rate of 5-7 breaths/minute when she fell asleep. ABG within parameters, and pulling decent volumes on vent when awake and breathing. Will place back on full support for now to allow her to sleep, will wean again in a few hours. Thresa RossA. Ashiyah Pavlak RN

## 2015-04-21 NOTE — Consult Note (Signed)
CONSULTATION NOTE  Reason for Consult: Aortic dissection  Requesting Physician: Dr. Tana Coast  Cardiologist: None (NEW)  HPI: This is a 35 y.o. female from [place] with a past medical history significant for tobacco abuse but otherwise no known medical problems. She presented to the hospital with chest pain radiating to her neck. Apparently, it was thought that she may have had a toxic inhalation exposure. She presented to the emergency department and was found to have an elevated troponin, but no ischemic EKG changes. A CT angiogram was performed with PE protocol that was negative for pulmonary embolus however it was noted that she had a large aneurysm. No mention of dissection was noted in that report however the timing bolus was not adequate for that. Subsequent reviewed does demonstrate a lucency in the descending aorta which could have been a dissection flap. She had persistent chest pain and was evaluated by Dr. 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 She was taken emergently to surgery yesterday for aneurysm repair with a 26 mm Hemashield Platinum graft placement and a valve sparing aortic root replacement Shanon Brow procedure) with a 26 mm Gelweave Valsalva aortic root graft. The left main and right coronary arteries were reimplanted. Postoperatively she has done remarkably well.she was uneventfully extubated and is maintaining oxygen saturations.she is maintaining sinus rhythm and blood pressures within normal limits. Cardiology is consult in for assistance in management and to establish long-term follow-up.  PMHx:  Past Medical History  Diagnosis Date  . Tobacco abuse   . 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 Essential hypertension   . Anemia   . 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 No pertinent past surgical history.  FAMHx: Family History  Problem Relation Age of Onset  . Aortic aneurysm Mother   . Coronary artery disease Mother   . Thyroid disease Mother   . Varicose Veins Mother     SOCHx:  reports that she has been smoking Cigarettes.  She has a 2 pack-year smoking history. She does not have any smokeless tobacco history on file. She reports that she does not drink alcohol. Her drug history is not on file.  ALLERGIES: No Known Allergies  ROS: Pertinent items noted in HPI and remainder of comprehensive ROS otherwise negative. main complaint is pain.  HOME MEDICATIONS:   Medication List    ASK your doctor about these medications        acetaminophen 500 MG tablet  Commonly known as:  TYLENOL  Take 500 mg by mouth every 6 (six) hours as needed for mild pain.        HOSPITAL MEDICATIONS: I have reviewed the patient's current medications.  VITALS: Blood pressure 128/66, pulse 84, temperature 98.7 F (37.1 C), temperature source Oral, resp. rate 21, height 5' 6"  (1.676 m), weight 137 lb 12.6 oz (62.5 kg), SpO2 97 %.  PHYSICAL EXAM: General appearance: alert and mild pain, sitting up Neck: no carotid bruit and no JVD Lungs: diminished breath sounds bilaterally Heart: regular rate and rhythm, S1, S2 normal, no murmur, click,  rub or gallop Abdomen: soft, non-tender; bowel sounds normal; no masses,  no organomegaly Extremities: extremities normal, atraumatic, no cyanosis or edema Pulses: 2+ and symmetric Skin: Skin color, texture, turgor normal. No rashes or lesions Neurologic: Mental status: Alert, oriented, thought content appropriate Psych:  Pleasant  LABS: Results for orders placed or performed during the hospital encounter of 04/19/15 (from the past 48 hour(s))  Basic metabolic panel     Status: Abnormal   Collection Time: 04/19/15  2:56 PM  Result Value Ref Range   Sodium 141 135 - 145 mmol/L   Potassium 4.3 3.5 - 5.1 mmol/L   Chloride 113 (H) 101 - 111 mmol/L   CO2 20 (L) 22 - 32 mmol/L   Glucose, Bld 93 65 - 99 mg/dL   BUN 14 6 - 20 mg/dL   Creatinine, Ser 0.83 0.44 - 1.00 mg/dL   Calcium 8.2 (L) 8.9 - 10.3 mg/dL   GFR calc non Af Amer >60 >60 mL/min   GFR calc Af Amer >60 >60 mL/min    Comment: (NOTE) The eGFR has been calculated using the CKD EPI equation. This calculation has not been validated in all clinical situations. eGFR's persistently <60 mL/min signify possible Chronic Kidney Disease.    Anion gap 8 5 - 15  CBC     Status: Abnormal   Collection Time: 04/19/15  2:56 PM  Result Value Ref Range   WBC 11.1 (H) 4.0 - 10.5 K/uL   RBC 3.90 3.87 - 5.11 MIL/uL   Hemoglobin 11.6 (L) 12.0 - 15.0 g/dL   HCT 35.9 (L) 36.0 - 46.0 %   MCV 92.1 78.0 - 100.0 fL   MCH 29.7 26.0 - 34.0 pg   MCHC 32.3 30.0 - 36.0 g/dL   RDW 13.1 11.5 - 15.5 %   Platelets 209 150 - 400 K/uL  D-dimer, quantitative (not at [facility])     Status: Abnormal   Collection Time: 04/19/15  2:56 PM  Result Value Ref Range   D-Dimer, Quant 6.35 (H) 0.00 - 0.50 ug/mL-FEU    Comment: (NOTE) At the manufacturer cut-off of 0.50 ug/mL FEU, this assay has been documented to exclude PE with a sensitivity and negative predictive value of 97 to 99%.  At this time, this assay has not been approved by the FDA to exclude DVT/VTE. Results should be correlated with clinical presentation.   Troponin I (q 6hr x 3)     Status: Abnormal   Collection Time: 04/19/15  2:56 PM  Result Value Ref Range   Troponin I 0.05 (H) <0.031 ng/mL    Comment:        PERSISTENTLY INCREASED TROPONIN VALUES IN THE RANGE OF 0.04-0.49 ng/mL CAN BE SEEN IN:       -UNSTABLE  ANGINA       -CONGESTIVE HEART FAILURE       -MYOCARDITIS       -CHEST TRAUMA       -ARRYHTHMIAS       -LATE PRESENTING MYOCARDIAL INFARCTION       -COPD   CLINICAL FOLLOW-UP RECOMMENDED.   Protime-INR     Status: None   Collection Time: 04/19/15  2:56 PM  Result Value Ref Range   Prothrombin Time 14.8 11.6 - 15.2 seconds   INR 1.14 0.00 - 1.49  APTT     Status: None   Collection Time: 04/19/15  2:56 PM  Result Value Ref Range   aPTT 31 24 - 37 seconds  I-stat troponin, ED (not  at [facility])     Status: None   Collection Time: 04/19/15  3:08 PM  Result Value Ref Range   Troponin i, poc 0.00 0.00 - 0.08 ng/mL   Comment 3            Comment: Due to the release kinetics of cTnI, a negative result within the first hours of the onset of symptoms does not rule out myocardial infarction with certainty. If myocardial infarction is still suspected, repeat the test at appropriate intervals.   I-Stat Beta hCG blood, ED ([facility], [facility], [facility] only)     Status: None   Collection Time: 04/19/15  5:25 PM  Result Value Ref Range   I-stat hCG, quantitative <5.0 <5 mIU/mL   Comment 3            Comment:   GEST. AGE      CONC.  (mIU/mL)   <=1 WEEK        5 - 50     2 WEEKS       50 - 500     3 WEEKS       100 - 10,000     4 WEEKS     1,000 - 30,000        FEMALE AND NON-PREGNANT FEMALE:     LESS THAN 5 mIU/mL   Troponin I     Status: None   Collection Time: 04/19/15 11:35 PM  Result Value Ref Range   Troponin I 0.03 <0.031 ng/mL    Comment:        NO INDICATION OF MYOCARDIAL INJURY.   MRSA PCR Screening     Status: None   Collection Time: 04/20/15 12:01 AM  Result Value Ref Range   MRSA by PCR NEGATIVE NEGATIVE    Comment:        The GeneXpert MRSA Assay (FDA approved for NASAL specimens only), is one component of a comprehensive MRSA colonization surveillance program. It is not intended to diagnose MRSA infection nor to guide or monitor treatment for MRSA infections.   Lipid panel      Status: None   Collection Time: 04/20/15  3:20 AM  Result Value Ref Range   Cholesterol 136 0 - 200 mg/dL   Triglycerides 37 <150 mg/dL   HDL 44 >40 mg/dL   Total CHOL/HDL Ratio 3.1 RATIO   VLDL 7 0 - 40 mg/dL   LDL Cholesterol 85 0 - 99 mg/dL    Comment:        Total Cholesterol/HDL:CHD Risk Coronary Heart Disease Risk Table                     Men   Women  1/2 Average Risk   3.4   3.3  Average Risk       5.0   4.4  2 X Average Risk   9.6   7.1  3 X Average Risk  23.4   11.0        Use the calculated Patient Ratio above and the CHD Risk Table to determine the patient's CHD Risk.        ATP III CLASSIFICATION (LDL):  <100     mg/dL   Optimal  100-129  mg/dL   Near or Above                    Optimal  130-159  mg/dL   Borderline  160-189  mg/dL   High  >190  mg/dL   Very High   Heparin level (unfractionated)     Status: Abnormal   Collection Time: 04/20/15  3:20 AM  Result Value Ref Range   Heparin Unfractionated 0.27 (L) 0.30 - 0.70 IU/mL    Comment:        IF HEPARIN RESULTS ARE BELOW EXPECTED VALUES, AND PATIENT DOSAGE HAS BEEN CONFIRMED, SUGGEST FOLLOW UP TESTING OF ANTITHROMBIN III LEVELS.   Troponin I (q 6hr x 3)     Status: None   Collection Time: 04/20/15  3:35 AM  Result Value Ref Range   Troponin I 0.03 <0.031 ng/mL    Comment:        NO INDICATION OF MYOCARDIAL INJURY.   Comprehensive metabolic panel     Status: Abnormal   Collection Time: 04/20/15  3:35 AM  Result Value Ref Range   Sodium 141 135 - 145 mmol/L   Potassium 3.9 3.5 - 5.1 mmol/L   Chloride 113 (H) 101 - 111 mmol/L   CO2 17 (L) 22 - 32 mmol/L   Glucose, Bld 147 (H) 65 - 99 mg/dL   BUN 13 6 - 20 mg/dL   Creatinine, Ser 0.61 0.44 - 1.00 mg/dL   Calcium 8.5 (L) 8.9 - 10.3 mg/dL   Total Protein 5.3 (L) 6.5 - 8.1 g/dL   Albumin 2.9 (L) 3.5 - 5.0 g/dL   AST 17 15 - 41 U/L   ALT 17 14 - 54 U/L   Alkaline Phosphatase 44 38 - 126 U/L   Total Bilirubin 0.8 0.3 - 1.2 mg/dL   GFR  calc non Af Amer >60 >60 mL/min   GFR calc Af Amer >60 >60 mL/min    Comment: (NOTE) The eGFR has been calculated using the CKD EPI equation. This calculation has not been validated in all clinical situations. eGFR's persistently <60 mL/min signify possible Chronic Kidney Disease.    Anion gap 11 5 - 15  CBC     Status: Abnormal   Collection Time: 04/20/15  3:35 AM  Result Value Ref Range   WBC 13.2 (H) 4.0 - 10.5 K/uL   RBC 3.67 (L) 3.87 - 5.11 MIL/uL   Hemoglobin 10.9 (L) 12.0 - 15.0 g/dL   HCT 33.9 (L) 36.0 - 46.0 %   MCV 92.4 78.0 - 100.0 fL   MCH 29.7 26.0 - 34.0 pg   MCHC 32.2 30.0 - 36.0 g/dL   RDW 13.1 11.5 - 15.5 %   Platelets 210 150 - 400 K/uL  Glucose, capillary     Status: Abnormal   Collection Time: 04/20/15  7:51 AM  Result Value Ref Range   Glucose-Capillary 155 (H) 65 - 99 mg/dL  Surgical pcr screen     Status: None   Collection Time: 04/20/15  9:15 AM  Result Value Ref Range   MRSA, PCR NEGATIVE NEGATIVE   Staphylococcus aureus NEGATIVE NEGATIVE    Comment:        The Xpert SA Assay (FDA approved for NASAL specimens in patients over 46 years of age), is one component of a comprehensive surveillance program.  Test performance has been validated by [facility] for patients greater than or equal to 89 year old. It is not intended to diagnose infection nor to guide or monitor treatment.   Prepare RBC (crossmatch)     Status: None   Collection Time: 04/20/15  9:54 AM  Result Value Ref Range   Order Confirmation ORDER PROCESSED BY BLOOD BANK   Type and screen [facility]  [place]     Status: None (Preliminary result)   Collection Time: 04/20/15  9:55 AM  Result Value Ref Range   ABO/RH(D) A POS    Antibody Screen NEG    Sample Expiration 04/23/2015    Unit Number [id number]    Blood Component Type RED CELLS,LR    Unit division 00    Status of Unit ISSUED,FINAL    Transfusion Status OK TO TRANSFUSE    Crossmatch Result Compatible     Unit Number [id number]    Blood Component Type RED CELLS,LR    Unit division 00    Status of Unit ISSUED,FINAL    Transfusion Status OK TO TRANSFUSE    Crossmatch Result Compatible    Unit Number [id number]    Blood Component Type RED CELLS,LR    Unit division 00    Status of Unit ALLOCATED    Transfusion Status OK TO TRANSFUSE    Crossmatch Result Compatible    Unit Number [id number]    Blood Component Type RED CELLS,LR    Unit division 00    Status of Unit ALLOCATED    Transfusion Status OK TO TRANSFUSE    Crossmatch Result Compatible    Unit Number [id number]    Blood Component Type RED CELLS,LR    Unit division 00    Status of Unit ALLOCATED    Transfusion Status OK TO TRANSFUSE    Crossmatch Result Compatible    Unit Number [id number]    Blood Component Type RED CELLS,LR    Unit division 00    Status of Unit ALLOCATED    Transfusion Status OK TO TRANSFUSE    Crossmatch Result Compatible   ABO/Rh     Status: None   Collection Time: 04/20/15  9:55 AM  Result Value Ref Range   ABO/RH(D) A POS   I-STAT, chem 8     Status: Abnormal   Collection Time: 04/20/15 10:19 AM  Result Value Ref Range   Sodium 140 135 - 145 mmol/L   Potassium 3.6 3.5 - 5.1 mmol/L   Chloride 107 101 - 111 mmol/L   BUN 12 6 - 20 mg/dL   Creatinine, Ser 0.40 (L) 0.44 - 1.00 mg/dL   Glucose, Bld 150 (H) 65 - 99 mg/dL   Calcium, Ion 1.21 1.12 - 1.23 mmol/L   TCO2 21 0 - 100 mmol/L   Hemoglobin 9.9 (L) 12.0 - 15.0 g/dL   HCT 29.0 (L) 36.0 - 46.0 %  I-STAT, chem 8     Status: Abnormal   Collection Time: 04/20/15 11:53 AM  Result Value Ref Range   Sodium 140 135 - 145 mmol/L   Potassium 4.1 3.5 - 5.1 mmol/L   Chloride 106 101 - 111 mmol/L   BUN 10 6 - 20 mg/dL   Creatinine, Ser 0.30 (L) 0.44 - 1.00 mg/dL   Glucose, Bld 141 (H) 65 - 99 mg/dL   Calcium, Ion 1.24 (H) 1.12 - 1.23 mmol/L   TCO2 23 0 - 100 mmol/L   Hemoglobin 9.2 (L) 12.0 - 15.0 g/dL   HCT 27.0 (L) 36.0 - 46.0 %   I-STAT 3, arterial blood gas (G3+)     Status: Abnormal   Collection Time: 04/20/15 12:23 PM  Result Value Ref Range   pH, Arterial 7.353 7.350 - 7.450   pCO2 arterial 39.5 35.0 - 45.0 mmHg   pO2, Arterial 318.0 (H) 80.0 - 100.0 mmHg   Bicarbonate 21.9 20.0 - 24.0 mEq/L   TCO2 23  0 - 100 mmol/L   O2 Saturation 100.0 %   Acid-base deficit 3.0 (H) 0.0 - 2.0 mmol/L   Patient temperature HIDE    Sample type ARTERIAL   I-STAT, chem 8     Status: Abnormal   Collection Time: 04/20/15 12:27 PM  Result Value Ref Range   Sodium 139 135 - 145 mmol/L   Potassium 5.0 3.5 - 5.1 mmol/L   Chloride 105 101 - 111 mmol/L   BUN 9 6 - 20 mg/dL   Creatinine, Ser 0.20 (L) 0.44 - 1.00 mg/dL   Glucose, Bld 141 (H) 65 - 99 mg/dL   Calcium, Ion 1.09 (L) 1.12 - 1.23 mmol/L   TCO2 22 0 - 100 mmol/L   Hemoglobin 8.2 (L) 12.0 - 15.0 g/dL   HCT 24.0 (L) 36.0 - 46.0 %  I-STAT 3, arterial blood gas (G3+)     Status: Abnormal   Collection Time: 04/20/15  1:18 PM  Result Value Ref Range   pH, Arterial 7.302 (L) 7.350 - 7.450   pCO2 arterial 43.8 35.0 - 45.0 mmHg   pO2, Arterial 418.0 (H) 80.0 - 100.0 mmHg   Bicarbonate 21.6 20.0 - 24.0 mEq/L   TCO2 23 0 - 100 mmol/L   O2 Saturation 100.0 %   Acid-base deficit 4.0 (H) 0.0 - 2.0 mmol/L   Patient temperature HIDE    Sample type ARTERIAL   I-STAT, chem 8     Status: Abnormal   Collection Time: 04/20/15  1:22 PM  Result Value Ref Range   Sodium 131 (L) 135 - 145 mmol/L   Potassium 4.3 3.5 - 5.1 mmol/L   Chloride 99 (L) 101 - 111 mmol/L   BUN 10 6 - 20 mg/dL   Creatinine, Ser 0.30 (L) 0.44 - 1.00 mg/dL   Glucose, Bld 215 (H) 65 - 99 mg/dL   Calcium, Ion 1.02 (L) 1.12 - 1.23 mmol/L   TCO2 21 0 - 100 mmol/L   Hemoglobin 6.8 (LL) 12.0 - 15.0 g/dL   HCT 20.0 (L) 36.0 - 46.0 %  I-STAT, chem 8     Status: Abnormal   Collection Time: 04/20/15  2:11 PM  Result Value Ref Range   Sodium 137 135 - 145 mmol/L   Potassium 3.8 3.5 - 5.1 mmol/L   Chloride 104 101 -  111 mmol/L   BUN 10 6 - 20 mg/dL   Creatinine, Ser 0.30 (L) 0.44 - 1.00 mg/dL   Glucose, Bld 200 (H) 65 - 99 mg/dL   Calcium, Ion 1.03 (L) 1.12 - 1.23 mmol/L   TCO2 23 0 - 100 mmol/L   Hemoglobin 7.5 (L) 12.0 - 15.0 g/dL   HCT 22.0 (L) 36.0 - 46.0 %  Hemoglobin and hematocrit, blood     Status: Abnormal   Collection Time: 04/20/15  2:55 PM  Result Value Ref Range   Hemoglobin 8.3 (L) 12.0 - 15.0 g/dL    Comment: REPEATED TO VERIFY DELTA CHECK NOTED    HCT 25.0 (L) 36.0 - 46.0 %  Platelet count     Status: Abnormal   Collection Time: 04/20/15  2:55 PM  Result Value Ref Range   Platelets 133 (L) 150 - 400 K/uL  Fibrinogen     Status: Abnormal   Collection Time: 04/20/15  2:55 PM  Result Value Ref Range   Fibrinogen 183 (L) 204 - 475 mg/dL  I-STAT, chem 8     Status: Abnormal   Collection Time: 04/20/15  2:59 PM  Result Value  Ref Range   Sodium 136 135 - 145 mmol/L   Potassium 5.6 (H) 3.5 - 5.1 mmol/L   Chloride 106 101 - 111 mmol/L   BUN 11 6 - 20 mg/dL   Creatinine, Ser 0.40 (L) 0.44 - 1.00 mg/dL   Glucose, Bld 216 (H) 65 - 99 mg/dL   Calcium, Ion 0.97 (L) 1.12 - 1.23 mmol/L   TCO2 24 0 - 100 mmol/L   Hemoglobin 8.2 (L) 12.0 - 15.0 g/dL   HCT 24.0 (L) 36.0 - 46.0 %  I-STAT, chem 8     Status: Abnormal   Collection Time: 04/20/15  4:00 PM  Result Value Ref Range   Sodium 142 135 - 145 mmol/L   Potassium 3.8 3.5 - 5.1 mmol/L   Chloride 104 101 - 111 mmol/L   BUN 10 6 - 20 mg/dL   Creatinine, Ser 0.30 (L) 0.44 - 1.00 mg/dL   Glucose, Bld 183 (H) 65 - 99 mg/dL   Calcium, Ion 1.01 (L) 1.12 - 1.23 mmol/L   TCO2 24 0 - 100 mmol/L   Hemoglobin 9.5 (L) 12.0 - 15.0 g/dL   HCT 28.0 (L) 36.0 - 46.0 %  I-STAT 3, arterial blood gas (G3+)     Status: Abnormal   Collection Time: 04/20/15  5:37 PM  Result Value Ref Range   pH, Arterial 7.238 (L) 7.350 - 7.450   pCO2 arterial 61.5 (HH) 35.0 - 45.0 mmHg   pO2, Arterial 79.0 (L) 80.0 - 100.0 mmHg   Bicarbonate 26.4 (H) 20.0 - 24.0  mEq/L   TCO2 28 0 - 100 mmol/L   O2 Saturation 93.0 %   Acid-base deficit 2.0 0.0 - 2.0 mmol/L   Patient temperature 36.3 C    Collection site ARTERIAL LINE    Drawn by Operator    Sample type ARTERIAL   I-STAT 4, (NA,K, GLUC, HGB,HCT)     Status: Abnormal   Collection Time: 04/20/15  5:37 PM  Result Value Ref Range   Sodium 146 (H) 135 - 145 mmol/L   Potassium 4.1 3.5 - 5.1 mmol/L   Glucose, Bld 104 (H) 65 - 99 mg/dL   HCT 33.0 (L) 36.0 - 46.0 %   Hemoglobin 11.2 (L) 12.0 - 15.0 g/dL  CBC     Status: Abnormal   Collection Time: 04/20/15  5:40 PM  Result Value Ref Range   WBC 18.8 (H) 4.0 - 10.5 K/uL   RBC 3.54 (L) 3.87 - 5.11 MIL/uL   Hemoglobin 10.9 (L) 12.0 - 15.0 g/dL   HCT 32.4 (L) 36.0 - 46.0 %   MCV 91.5 78.0 - 100.0 fL   MCH 30.8 26.0 - 34.0 pg   MCHC 33.6 30.0 - 36.0 g/dL   RDW 13.7 11.5 - 15.5 %   Platelets 103 (L) 150 - 400 K/uL    Comment: CONSISTENT WITH PREVIOUS RESULT  Protime-INR     Status: Abnormal   Collection Time: 04/20/15  5:40 PM  Result Value Ref Range   Prothrombin Time 19.0 (H) 11.6 - 15.2 seconds   INR 1.59 (H) 0.00 - 1.49  APTT     Status: None   Collection Time: 04/20/15  5:40 PM  Result Value Ref Range   aPTT 37 24 - 37 seconds    Comment:        IF BASELINE aPTT IS ELEVATED, SUGGEST PATIENT RISK ASSESSMENT BE USED TO DETERMINE APPROPRIATE ANTICOAGULANT THERAPY.   Prepare Pheresed Platelets     Status: None  Collection Time: 04/20/15  6:57 PM  Result Value Ref Range   Unit Number [id number]    Blood Component Type PLTP LR1 PAS    Unit division 00    Status of Unit ISSUED,FINAL    Transfusion Status OK TO TRANSFUSE   Prepare fresh frozen plasma     Status: None   Collection Time: 04/20/15  6:57 PM  Result Value Ref Range   Unit Number [id number]    Blood Component Type THAWED PLASMA    Unit division 00    Status of Unit ISSUED,FINAL    Transfusion Status OK TO TRANSFUSE    Unit Number [id number]    Blood Component  Type THAWED PLASMA    Unit division 00    Status of Unit ISSUED,FINAL    Transfusion Status OK TO TRANSFUSE   Prepare cryoprecipitate     Status: None   Collection Time: 04/20/15  6:58 PM  Result Value Ref Range   Unit Number [id number]    Blood Component Type CRYPOOL THAW    Unit division 00    Status of Unit ISSUED,FINAL    Transfusion Status OK TO TRANSFUSE   Glucose, capillary     Status: Abnormal   Collection Time: 04/20/15  6:59 PM  Result Value Ref Range   Glucose-Capillary 60 (L) 65 - 99 mg/dL  Glucose, capillary     Status: Abnormal   Collection Time: 04/20/15  7:18 PM  Result Value Ref Range   Glucose-Capillary 103 (H) 65 - 99 mg/dL  I-STAT 3, arterial blood gas (G3+)     Status: Abnormal   Collection Time: 04/20/15  7:22 PM  Result Value Ref Range   pH, Arterial 7.297 (L) 7.350 - 7.450   pCO2 arterial 47.5 (H) 35.0 - 45.0 mmHg   pO2, Arterial 120.0 (H) 80.0 - 100.0 mmHg   Bicarbonate 23.4 20.0 - 24.0 mEq/L   TCO2 25 0 - 100 mmol/L   O2 Saturation 98.0 %   Acid-base deficit 3.0 (H) 0.0 - 2.0 mmol/L   Patient temperature 36.4 C    Collection site ARTERIAL LINE    Drawn by Nurse    Sample type ARTERIAL   Glucose, capillary     Status: None   Collection Time: 04/20/15  8:21 PM  Result Value Ref Range   Glucose-Capillary 92 65 - 99 mg/dL   Comment 1 Arterial Specimen   I-STAT 3, arterial blood gas (G3+)     Status: Abnormal   Collection Time: 04/20/15 10:45 PM  Result Value Ref Range   pH, Arterial 7.329 (L) 7.350 - 7.450   pCO2 arterial 45.6 (H) 35.0 - 45.0 mmHg   pO2, Arterial 114.0 (H) 80.0 - 100.0 mmHg   Bicarbonate 24.1 (H) 20.0 - 24.0 mEq/L   TCO2 25 0 - 100 mmol/L   O2 Saturation 98.0 %   Acid-base deficit 2.0 0.0 - 2.0 mmol/L   Patient temperature 36.8 C    Sample type ARTERIAL   Glucose, capillary     Status: Abnormal   Collection Time: 04/21/15 12:05 AM  Result Value Ref Range   Glucose-Capillary 151 (H) 65 - 99 mg/dL   Comment 1 Arterial  Specimen   I-STAT, chem 8     Status: Abnormal   Collection Time: 04/21/15 12:08 AM  Result Value Ref Range   Sodium 142 135 - 145 mmol/L   Potassium 4.9 3.5 - 5.1 mmol/L   Chloride 108 101 - 111 mmol/L   BUN 18 6 -  20 mg/dL   Creatinine, Ser 0.50 0.44 - 1.00 mg/dL   Glucose, Bld 152 (H) 65 - 99 mg/dL   Calcium, Ion 1.09 (L) 1.12 - 1.23 mmol/L   TCO2 27 0 - 100 mmol/L   Hemoglobin 9.9 (L) 12.0 - 15.0 g/dL   HCT 29.0 (L) 36.0 - 46.0 %  I-STAT 3, arterial blood gas (G3+)     Status: Abnormal   Collection Time: 04/21/15  2:16 AM  Result Value Ref Range   pH, Arterial 7.334 (L) 7.350 - 7.450   pCO2 arterial 43.1 35.0 - 45.0 mmHg   pO2, Arterial 117.0 (H) 80.0 - 100.0 mmHg   Bicarbonate 22.9 20.0 - 24.0 mEq/L   TCO2 24 0 - 100 mmol/L   O2 Saturation 98.0 %   Acid-base deficit 3.0 (H) 0.0 - 2.0 mmol/L   Patient temperature 37.4 C    Sample type ARTERIAL   CBC     Status: Abnormal   Collection Time: 04/21/15  3:05 AM  Result Value Ref Range   WBC 17.4 (H) 4.0 - 10.5 K/uL   RBC 3.24 (L) 3.87 - 5.11 MIL/uL   Hemoglobin 9.6 (L) 12.0 - 15.0 g/dL   HCT 29.4 (L) 36.0 - 46.0 %   MCV 90.7 78.0 - 100.0 fL   MCH 29.6 26.0 - 34.0 pg   MCHC 32.7 30.0 - 36.0 g/dL   RDW 14.0 11.5 - 15.5 %   Platelets 104 (L) 150 - 400 K/uL    Comment: CONSISTENT WITH PREVIOUS RESULT  Basic metabolic panel     Status: Abnormal   Collection Time: 04/21/15  3:05 AM  Result Value Ref Range   Sodium 143 135 - 145 mmol/L   Potassium 4.2 3.5 - 5.1 mmol/L   Chloride 114 (H) 101 - 111 mmol/L   CO2 22 22 - 32 mmol/L   Glucose, Bld 133 (H) 65 - 99 mg/dL   BUN 14 6 - 20 mg/dL   Creatinine, Ser 0.57 0.44 - 1.00 mg/dL   Calcium 7.4 (L) 8.9 - 10.3 mg/dL   GFR calc non Af Amer >60 >60 mL/min   GFR calc Af Amer >60 >60 mL/min    Comment: (NOTE) The eGFR has been calculated using the CKD EPI equation. This calculation has not been validated in all clinical situations. eGFR's persistently <60 mL/min signify possible  Chronic Kidney Disease.    Anion gap 7 5 - 15  Magnesium     Status: Abnormal   Collection Time: 04/21/15  3:05 AM  Result Value Ref Range   Magnesium 2.9 (H) 1.7 - 2.4 mg/dL  Glucose, capillary     Status: Abnormal   Collection Time: 04/21/15  3:34 AM  Result Value Ref Range   Glucose-Capillary 113 (H) 65 - 99 mg/dL   Comment 1 Arterial Specimen   I-STAT 3, arterial blood gas (G3+)     Status: Abnormal   Collection Time: 04/21/15  5:29 AM  Result Value Ref Range   pH, Arterial 7.426 7.350 - 7.450   pCO2 arterial 37.5 35.0 - 45.0 mmHg   pO2, Arterial 106.0 (H) 80.0 - 100.0 mmHg   Bicarbonate 24.5 (H) 20.0 - 24.0 mEq/L   TCO2 26 0 - 100 mmol/L   O2 Saturation 98.0 %   Patient temperature 37.7 C    Sample type ARTERIAL   I-STAT 3, arterial blood gas (G3+)     Status: Abnormal   Collection Time: 04/21/15  6:37 AM  Result Value Ref Range  pH, Arterial 7.328 (L) 7.350 - 7.450   pCO2 arterial 39.0 35.0 - 45.0 mmHg   pO2, Arterial 90.0 80.0 - 100.0 mmHg   Bicarbonate 20.3 20.0 - 24.0 mEq/L   TCO2 21 0 - 100 mmol/L   O2 Saturation 96.0 %   Acid-base deficit 5.0 (H) 0.0 - 2.0 mmol/L   Patient temperature 37.7 C    Sample type ARTERIAL   Glucose, capillary     Status: Abnormal   Collection Time: 04/21/15  7:16 AM  Result Value Ref Range   Glucose-Capillary 117 (H) 65 - 99 mg/dL   Comment 1 Capillary Specimen    Comment 2 Notify RN     IMAGING: Dg Chest 2 View  04/19/2015  CLINICAL DATA:  Shortness of breath, chest pain/tightness, nausea, blurred vision today. Recent flu-like symptoms. EXAM: CHEST  2 VIEW COMPARISON:  None. FINDINGS: Given the slight patient rotation, cardiomediastinal silhouette appears within normal limits in size and configuration. Heart size is normal. Lungs are clear. No pleural effusion. Osseous structures about the chest are unremarkable. IMPRESSION: Lungs are clear and there is no evidence of acute cardiopulmonary abnormality. No evidence of pneumonia.  Electronically Signed   By: Franki Cabot M.D.   On: 04/19/2015 15:23   Ct Angio Chest Pe W/cm &/or Wo Cm  04/19/2015  CLINICAL DATA:  Shortness of breath and chest pain EXAM: CT ANGIOGRAPHY CHEST WITH CONTRAST TECHNIQUE: Multidetector CT imaging of the chest was performed using the standard protocol during bolus administration of intravenous contrast. Multiplanar CT image reconstructions and MIPs were obtained to evaluate the vascular anatomy. CONTRAST:  149m OMNIPAQUE IOHEXOL 350 MG/ML SOLN COMPARISON:  Chest radiograph April 19, 2015 FINDINGS: Mediastinum/Lymph Nodes: There is no demonstrable pulmonary embolus. There is dilatation of the proximal ascending thoracic aorta with a transverse diameter of 5.2 x 4.8 cm. The visualized great vessels appear unremarkable. Pericardium is not appreciably thickened. Thyroid appears unremarkable. There is no demonstrable thoracic adenopathy. Lungs/Pleura: There is slight scarring in the right lung base region. There are a few scattered small bullae in the upper lobes. There is upper and lower lobe bronchiectatic change. There is no parenchymal lung edema or consolidation. Upper abdomen: There is reflux of contrast into the aorta and hepatic veins. The visualized upper abdominal structures otherwise appear unremarkable. Musculoskeletal: There are no blastic or lytic bone lesions. There is asymmetric breast tissue in the lateral right breast compared to the left. Review of the MIP images confirms the above findings. IMPRESSION: No demonstrable pulmonary embolus. There is dilatation of the ascending thoracic aorta with a transverse diameter of 5.2 x 4.8 cm. Ascending thoracic aortic aneurysm. Recommend semi-annual imaging followup by CTA or MRA and referral to cardiothoracic surgery if not already obtained. This recommendation follows 2010 ACCF/AHA/AATS/ACR/ASA/SCA/SCAI/SIR/STS/SVM Guidelines for the Diagnosis and Management of Patients With Thoracic Aortic Disease.  Circulation. 2010; 121:: [id number] Suspect aortic stenosis given this degree of proximal ascending thoracic aortic dilatation. No edema or consolidation. Mild upper and lower lobe bronchiectatic change bilaterally. No adenopathy apparent. Reflux of contrast into the inferior vena cava and hepatic veins. Suspect a degree of increased right heart pressure. Asymmetric breast tissue lateral right breast compared to the left. This finding warrants physical examination. Consider breast ultrasound if palpable abnormality is noted in the lateral right breast. Electronically Signed   By: WLowella GripIII M.D.   On: 04/19/2015 18:39   Dg Chest Port 1 View  04/21/2015  CLINICAL DATA:  Hypoxia.  Postoperative aortic dissection repair  EXAM: PORTABLE CHEST 1 VIEW COMPARISON:  April 20, 2015 FINDINGS: Endotracheal tube tip is 5.0 cm above carina. Swan-Ganz catheter tip is in the main pulmonary outflow tract near the pulmonic valve. There is a chest tube with tip and side-port in the stomach. There is a left chest tube, a right chest tube, and a mediastinal drain. There is a small left apical pneumothorax without tension component. There is atelectatic change in the left base. There is a minimal right effusion. Lungs elsewhere clear. Heart size is normal. Aorta is prominent but stable. IMPRESSION: Open catheter positions as described without pneumothorax. Small left apical pneumothorax without tension component. Left lower lobe atelectasis. Rather minimal right pleural effusion. No change in cardiac silhouette. Electronically Signed   By: Lowella Grip III M.D.   On: 04/21/2015 07:58   Dg Chest Port 1 View  04/20/2015  CLINICAL DATA:  Acute ascending aortic dissection postop EXAM: PORTABLE CHEST 1 VIEW COMPARISON:  04/19/2015 FINDINGS: Endotracheal tube tip about 2.5 cm above the carina. NG tube crosses the gastroesophageal junction. There is a mediastinal drain. There is a right chest tube. The there is a left chest  tube. There is mild atelectasis in the right lower lobe. On the left, there is 16 mm pneumothorax over the upper lobe. There is a small left pleural effusion. There is airspace disease in the lingula. The aortic arch is prominent. IMPRESSION: 16 mm left pneumothorax. Opacity in the lingula possibly atelectasis. Pneumonitis not excluded. Critical Value/emergent results were called by telephone at the time of interpretation on 04/20/2015 at 5:58 pm to Baxter Flattery, the patient's nurse, who verbally acknowledged these results. Electronically Signed   By: Skipper Cliche M.D.   On: 04/20/2015 17:58   Ct Angio Chest Aorta W/cm &/or Wo/cm  04/20/2015  CLINICAL DATA:  Evaluate for dissection.  Chest pain. EXAM: CT ANGIOGRAPHY CHEST WITH CONTRAST TECHNIQUE: Multidetector CT imaging of the chest was performed using the standard protocol during bolus administration of intravenous contrast. Multiplanar CT image reconstructions and MIPs were obtained to evaluate the vascular anatomy. CONTRAST:  147m OMNIPAQUE IOHEXOL 350 MG/ML SOLN COMPARISON:  CT scan April 19, 2015 FINDINGS: There is a thoracic aortic dissection extending from the root of the aorta, at the level of the aortic valves, through the arch and descending thoracic aorta into the upper abodminal aorta. The remainder of the abdominal aorta was not included on this study. There is significant motion near the heart making it difficult to confidently determine the true versus false lumen. However, I believe the true lumen is the more central smaller lumen. The dissection extends into the proximal right brachiocephalic artery and the left common carotid artery. The left common carotid artery is partially thrombosed and significantly narrowed in the neck. The dissection also extends into the proximal aspect of the left subclavian artery. The dissection also extended into the root of the superior mesenteric artery. I see no definitive extension into the celiac artery. The  thoracic aorta is aneurysmal at its origin measuring 5.7 x 5.1 cm. It is difficult to evaluate the origins of the coronary arteries on this study. The study was not tailored to evaluate pulmonary arteries but no emboli are seen. No pleural effusions are noted. No active extravasation from the dissecting aorta is identified. However, there is high attenuation fluid in the mediastinum which is increased since yesterday consistent with mediastinal hematoma. No adenopathy. Prominence of the lateral right breast tissue as described yesterday could be further assessed with mammography. Secretions  seen in right lower lobe airways. Central airways are normal. Bibasilar atelectasis is identified. No pulmonary nodules, masses, or focal infiltrates. Evaluation of the upper abdomen demonstrates no other abnormalities. Visualized bones are normal. Review of the MIP images confirms the above findings. IMPRESSION: 1. 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 An abdominal CTA was not performed to completely evaluate the abdominal aorta. The dissection extends into the proximal right brachiocephalic artery, left common carotid artery, and left subclavian artery. It likely extends superiorly into the left common carotid artery which is significant narrowed with thrombus within the neck. The dissection also extends into the root of the superior mesenteric artery with apparent sparing of this celiac artery. The true lumen appears to be the smaller more central lumen and the vessels described above all appear to arise from the true lumen. 2. Increasing high attenuation fluid in the mediastinum consistent with hematoma. The findings were called by myself to Dr. Roxy Manns at 8:45 a.m. Electronically Signed   By: Dorise Bullion III M.D   On: 04/20/2015 09:10   EKG: Normal sinus rhythm at [place]: Principal Problem:   S/P repair of acute aortic dissection and  valve-sparing root replacement with aortic valve repair Active Problems:   Tobacco abuse   Chest pain   Thoracic ascending aortic aneurysm (HCC)   NSTEMI (non-ST elevated myocardial infarction) ([place])   Aortic aneurysm with dissection ([place])   Aortic dissection (HCC)   Essential hypertension   Anemia  RECOMMENDATION: 1. She is doing remarkably well today. 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 Genetic testing may be helpful at some point, particularly in screening siblings (although it is listed that her mother had aortic aneurysm as well). There is limited evidence that an ACE inhibitor may be helpful at some point for long-term maintenance. I'm happy to see her back in the office for ongoing follow-up and surveillance after her recovery.   Time Spent Directly with Patient: 30 minutes  Pixie Casino, MD, [facility] Attending Cardiologist [place] 04/21/2015, 12:36 PM

## 2015-04-21 NOTE — Progress Notes (Signed)
RT & RN instructed pt on use of flutter valve. Pt able to use with good effort.

## 2015-04-21 NOTE — Procedures (Signed)
Extubation Procedure Note Pre Extubation: Completed Rapid Wean successfully. ABG acceptable. Follows all commands.  NIF -46; VC 1.2L. Cuff leak present. Mouth/Airway suctioned prior to extubation  Post Extubation: Extubated to 4L [place]. Clearly speaks name/location. Congested Cough w/ minimal secretions expelled. Pt was sick prior to surgery. Pt is smoker. IS 1200mls. No Stridor.    Patient Details:   Name: [name] [name] DOB: 28-Dec-1980 MRN: [id number]     Evaluation  O2 sats: stable throughout Complications: No apparent complications Patient did tolerate procedure well. Bilateral Breath Sounds: Diminished Suctioning: Airway Yes  Elmer PickerClifton, Jisselle Poth D 04/21/2015, 5:48 AM

## 2015-04-22 ENCOUNTER — Encounter (HOSPITAL_COMMUNITY): Payer: Self-pay | Admitting: Thoracic Surgery (Cardiothoracic Vascular Surgery)

## 2015-04-22 ENCOUNTER — Inpatient Hospital Stay (HOSPITAL_COMMUNITY): Payer: Medicaid Other

## 2015-04-22 LAB — GLUCOSE, CAPILLARY
GLUCOSE-CAPILLARY: 109 mg/dL — AB (ref 65–99)
GLUCOSE-CAPILLARY: 124 mg/dL — AB (ref 65–99)
GLUCOSE-CAPILLARY: 139 mg/dL — AB (ref 65–99)
Glucose-Capillary: 99 mg/dL (ref 65–99)
Glucose-Capillary: 126 mg/dL — ABNORMAL HIGH (ref 65–99)

## 2015-04-22 LAB — BASIC METABOLIC PANEL
ANION GAP: 5 (ref 5–15)
BUN: 22 mg/dL — AB (ref 6–20)
CALCIUM: 7.9 mg/dL — AB (ref 8.9–10.3)
CO2: 24 mmol/L (ref 22–32)
Chloride: 106 mmol/L (ref 101–111)
Creatinine, Ser: 0.62 mg/dL (ref 0.44–1.00)
GFR calc Af Amer: >60 mL/min (ref >60)
GLUCOSE: 107 mg/dL — AB (ref 65–99)
Potassium: 3.9 mmol/L (ref 3.5–5.1)
Sodium: 135 mmol/L (ref 135–145)

## 2015-04-22 LAB — CBC
HCT: 27.9 % — ABNORMAL LOW (ref 36.0–46.0)
Hemoglobin: 9.1 g/dL — ABNORMAL LOW (ref 12.0–15.0)
MCH: 29.9 pg (ref 26.0–34.0)
MCHC: 32.6 g/dL (ref 30.0–36.0)
MCV: 91.8 fL (ref 78.0–100.0)
Platelets: 109 10*3/uL — ABNORMAL LOW (ref 150–400)
RBC: 3.04 MIL/uL — ABNORMAL LOW (ref 3.87–5.11)
RDW: 13.9 % (ref 11.5–15.5)
WBC: 18.6 10*3/uL — ABNORMAL HIGH (ref 4.0–10.5)

## 2015-04-22 LAB — HEMOGLOBIN A1C
HEMOGLOBIN A1C: 5.4 % (ref 4.8–5.6)
MEAN PLASMA GLUCOSE: 108 mg/dL

## 2015-04-22 MED ORDER — FUROSEMIDE 20 MG PO TABS
20.0000 mg | ORAL_TABLET | Freq: Every day | ORAL | Status: DC
  Administered 2015-04-23: 20 mg via ORAL
  Filled 2015-04-22: qty 1

## 2015-04-22 MED ORDER — POTASSIUM CHLORIDE CRYS ER 20 MEQ PO TBCR
20.0000 meq | EXTENDED_RELEASE_TABLET | Freq: Every day | ORAL | Status: AC
  Administered 2015-04-24: 20 meq via ORAL
  Filled 2015-04-22 (×2): qty 1

## 2015-04-22 MED ORDER — METOPROLOL TARTRATE 25 MG PO TABS
25.0000 mg | ORAL_TABLET | Freq: Two times a day (BID) | ORAL | Status: DC
  Administered 2015-04-22 (×2): 25 mg via ORAL
  Filled 2015-04-22 (×2): qty 1

## 2015-04-22 MED ORDER — SODIUM CHLORIDE 0.9% FLUSH: 3.0000 mL | INTRAVENOUS | Status: DC | PRN

## 2015-04-22 MED ORDER — FUROSEMIDE 10 MG/ML IJ SOLN
20.0000 mg | Freq: Two times a day (BID) | INTRAMUSCULAR | Status: AC
  Administered 2015-04-22 (×2): 20 mg via INTRAVENOUS
  Filled 2015-04-22 (×2): qty 2

## 2015-04-22 MED ORDER — KETOROLAC TROMETHAMINE 15 MG/ML IJ SOLN
15.0000 mg | Freq: Four times a day (QID) | INTRAMUSCULAR | Status: AC
  Administered 2015-04-22 – 2015-04-23 (×5): 15 mg via INTRAVENOUS
  Filled 2015-04-22 (×6): qty 1

## 2015-04-22 MED ORDER — SODIUM CHLORIDE 0.9 % IV SOLN: 250.0000 mL | INTRAVENOUS | Status: DC | PRN

## 2015-04-22 MED ORDER — MOVING RIGHT ALONG BOOK
Freq: Once | Status: AC
  Administered 2015-04-23: 10:00:00
  Filled 2015-04-22: qty 1

## 2015-04-22 MED ORDER — SODIUM CHLORIDE 0.9% FLUSH
3.0000 mL | Freq: Two times a day (BID) | INTRAVENOUS | Status: DC
  Administered 2015-04-22 – 2015-04-25 (×8): 3 mL via INTRAVENOUS

## 2015-04-22 MED FILL — Sodium Bicarbonate IV Soln 8.4%: INTRAVENOUS | Qty: 200 | Status: AC

## 2015-04-22 MED FILL — Electrolyte-R (PH 7.4) Solution: INTRAVENOUS | Qty: 5000 | Status: AC

## 2015-04-22 MED FILL — Calcium Chloride Inj 10%: INTRAVENOUS | Qty: 10 | Status: AC

## 2015-04-22 MED FILL — Heparin Sodium (Porcine) Inj 1000 Unit/ML: INTRAMUSCULAR | Qty: 10 | Status: AC

## 2015-04-22 MED FILL — Lidocaine HCl IV Inj 20 MG/ML: INTRAVENOUS | Qty: 5 | Status: AC

## 2015-04-22 MED FILL — Sodium Chloride IV Soln 0.9%: INTRAVENOUS | Qty: 2000 | Status: AC

## 2015-04-22 MED FILL — Mannitol IV Soln 20%: INTRAVENOUS | Qty: 500 | Status: AC

## 2015-04-22 NOTE — Progress Notes (Addendum)
[address]       [place] [id number]             [phone]        CARDIOTHORACIC SURGERY PROGRESS NOTE   R2 Days Post-Op Procedure(s) (LRB): VALVE-SPARING AORTIC ROOT REPLACEMENT USING A 26MM GELWEAVE VALSALVA GRAFT AND REIMPLANTATION OF RIGHT AND LEFT CORONARY ARTERIES  (N/A) REPAIR OF AORTIC DISSECTION WITH A 26MM HEMASHIELD GRAFT  Subjective: Looks good.  Sore in chest.  Poor appetite.  No abdominal pain.  Denies SOB  Objective: Vital signs: BP Readings from Last 1 Encounters:  04/22/15 113/65   Pulse Readings from Last 1 Encounters:  04/22/15 80   Resp Readings from Last 1 Encounters:  04/22/15 17   Temp Readings from Last 1 Encounters:  04/22/15 99 F (37.2 C) Oral    Hemodynamics: PAP: (21-29)/(3-10) 29/8 mmHg  Physical Exam:  Rhythm:   sinus  Breath sounds: clear  Heart sounds:  RRR w/out murmur  Incisions:  Dressings dry, intact  Abdomen:  Soft, non-distended, non-tender  Extremities:  Warm, well-perfused  Chest tubes:  low volume thin serosanguinous output, no air leak    Intake/Output from previous day: 03/19 0701 - 03/20 0700 In: 1550 [P.O.:360; I.V.:940; IV Piggyback:250] Out: 3030 [Urine:2020; Chest Tube:1010] Intake/Output this shift:    Lab Results:  CBC: Recent Labs  04/21/15 1520 04/21/15 1525 04/22/15 0503  WBC 19.8*  --  18.6*  HGB 9.4* 10.9* 9.1*  HCT 28.7* 32.0* 27.9*  PLT 119*  --  109*    BMET:  Recent Labs  04/21/15 0305  04/21/15 1525 04/22/15 0503  NA 143  --  140 135  K 4.2  --  3.7 3.9  CL 114*  --  104 106  CO2 22  --   --  24  GLUCOSE 133*  --  132* 107*  BUN 14  --  20 22*  CREATININE 0.57  < > 0.60 0.62  CALCIUM 7.4*  --   --  7.9*  < > = values in this interval not displayed.   PT/INR:   Recent Labs  04/20/15 1740  LABPROT 19.0*  INR 1.59*    CBG (last 3)   Recent Labs  04/21/15 2022 04/22/15 0035 04/22/15 0427  GLUCAP 137* 139* 99    ABG    Component Value  Date/Time   PHART 7.328* 04/21/2015 0637   PCO2ART 39.0 04/21/2015 0637   PO2ART 90.0 04/21/2015 0637   HCO3 20.3 04/21/2015 0637   TCO2 21 04/21/2015 1525   ACIDBASEDEF 5.0* 04/21/2015 0637   O2SAT 96.0 04/21/2015 0637    CXR: PORTABLE CHEST 1 VIEW  COMPARISON: Single view of the chest 04/21/2015 and 04/20/2015.  FINDINGS: Endotracheal tube and NG tube have been removed. Chest tubes and mediastinal drain remain in place. There has been some increase in right basilar atelectasis. Subsegmental atelectasis in the left lung base is unchanged. A small left apical pneumothorax is slightly increased since yesterday's examination. No right pneumothorax is identified. Heart size is upper normal.  IMPRESSION: Status post extubation and NG tube removal.  Slight increase in a small left pneumothorax with a left chest tube in place.  Bibasilar atelectasis show some increase on the right.   Electronically Signed  By: Drusilla Kannerhomas Dalessio M.D.  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

## 2015-04-22 NOTE — Progress Notes (Signed)
Patient ID: [name] BullaMelinda [name], female   DOB: 1980-08-20, 35 y.o.   MRN: [id number] EVENING ROUNDS NOTE :     [address]       [place] [id number]             [phone]                 2 Days Post-Op Procedure(s) (LRB): VALVE-SPARING AORTIC ROOT REPLACEMENT USING A 26MM GELWEAVE VALSALVA GRAFT AND REIMPLANTATION OF RIGHT AND LEFT CORONARY ARTERIES  (N/A) REPAIR OF AORTIC DISSECTION WITH A 26MM HEMASHIELD GRAFT  Total Length of Stay:  LOS: 2 days  BP 117/64 mmHg  Pulse 71  Temp(Src) 97.8 F (36.6 C) (Oral)  Resp 13  Ht 5\' 6"  (1.676 m)  Wt 150 lb 12.7 oz (68.4 kg)  BMI 24.35 kg/m2  SpO2 94%  .Intake/Output      03/19 0701 - 03/20 0700 03/20 0701 - 03/21 0700   P.O. 360 300   I.V. (mL/kg) 940 (13.7)    Blood     IV Piggyback 250    Total Intake(mL/kg) 1550 (22.7) 300 (4.4)   Urine (mL/kg/hr) 1670 (1) 470 (0.6)   Emesis/NG output     Blood     Chest Tube 1010 (0.6) 70 (0.1)   Total Output 2680 540   Net -1130 -240              Lab Results  Component Value Date   WBC 18.6* 04/22/2015   HGB 9.1* 04/22/2015   HCT 27.9* 04/22/2015   PLT 109* 04/22/2015   GLUCOSE 107* 04/22/2015   CHOL 136 04/20/2015   TRIG 37 04/20/2015   HDL 44 04/20/2015   LDLCALC 85 04/20/2015   ALT 17 04/20/2015   AST 17 04/20/2015   NA 135 04/22/2015   K 3.9 04/22/2015   CL 106 04/22/2015   CREATININE 0.62 04/22/2015   BUN 22* 04/22/2015   CO2 24 04/22/2015   INR 1.59* 04/20/2015   HGBA1C 5.4 04/20/2015   Stable day, ambulated  Delight OvensEdward B Chealsey Miyamoto MD  [facility]eeper [phone] Office [phone] 04/22/2015 5:53 PM

## 2015-04-23 ENCOUNTER — Inpatient Hospital Stay (HOSPITAL_COMMUNITY): Payer: Medicaid Other

## 2015-04-23 LAB — CBC
HEMATOCRIT: 26.2 % — AB (ref 36.0–46.0)
HEMOGLOBIN: 8.5 g/dL — AB (ref 12.0–15.0)
MCH: 29.6 pg (ref 26.0–34.0)
MCHC: 32.4 g/dL (ref 30.0–36.0)
MCV: 91.3 fL (ref 78.0–100.0)
Platelets: 97 10*3/uL — ABNORMAL LOW (ref 150–400)
RBC: 2.87 MIL/uL — ABNORMAL LOW (ref 3.87–5.11)
RDW: 13.5 % (ref 11.5–15.5)
WBC: 13.1 10*3/uL — AB (ref 4.0–10.5)

## 2015-04-23 LAB — BASIC METABOLIC PANEL
Anion gap: 11 (ref 5–15)
BUN: 25 mg/dL — ABNORMAL HIGH (ref 6–20)
CHLORIDE: 102 mmol/L (ref 101–111)
CO2: 23 mmol/L (ref 22–32)
Calcium: 7.8 mg/dL — ABNORMAL LOW (ref 8.9–10.3)
Creatinine, Ser: 0.66 mg/dL (ref 0.44–1.00)
GFR calc Af Amer: >60 mL/min (ref >60)
GFR calc non Af Amer: >60 mL/min (ref >60)
GLUCOSE: 100 mg/dL — AB (ref 65–99)
POTASSIUM: 3.5 mmol/L (ref 3.5–5.1)
Sodium: 136 mmol/L (ref 135–145)

## 2015-04-23 MED ORDER — POTASSIUM CHLORIDE CRYS ER 20 MEQ PO TBCR
40.0000 meq | EXTENDED_RELEASE_TABLET | Freq: Once | ORAL | Status: AC
  Administered 2015-04-23: 40 meq via ORAL
  Filled 2015-04-23: qty 2

## 2015-04-23 MED ORDER — LABETALOL HCL 5 MG/ML IV SOLN: 10.0000 mg | INTRAVENOUS | Status: DC | PRN

## 2015-04-23 MED ORDER — POTASSIUM CHLORIDE CRYS ER 20 MEQ PO TBCR
20.0000 meq | EXTENDED_RELEASE_TABLET | ORAL | Status: DC | PRN
  Administered 2015-04-23: 20 meq via ORAL

## 2015-04-23 MED ORDER — METOPROLOL TARTRATE 25 MG PO TABS
25.0000 mg | ORAL_TABLET | Freq: Three times a day (TID) | ORAL | Status: DC
  Administered 2015-04-23 – 2015-04-24 (×5): 25 mg via ORAL
  Filled 2015-04-23 (×5): qty 1

## 2015-04-23 MED ORDER — POLYSACCHARIDE IRON COMPLEX 150 MG PO CAPS
150.0000 mg | ORAL_CAPSULE | Freq: Every day | ORAL | Status: DC
  Administered 2015-04-23 – 2015-04-26 (×4): 150 mg via ORAL
  Filled 2015-04-23 (×4): qty 1

## 2015-04-23 MED ORDER — FOLIC ACID 1 MG PO TABS
1.0000 mg | ORAL_TABLET | Freq: Every day | ORAL | Status: DC
  Administered 2015-04-23 – 2015-04-26 (×4): 1 mg via ORAL
  Filled 2015-04-23 (×5): qty 1

## 2015-04-23 NOTE — Progress Notes (Signed)
Pacing wires removed per order, tolerated well.

## 2015-04-23 NOTE — Care Management Note (Signed)
Case Management Note Donn PieriniKristi Ren Grasse RN, BSN Unit 2W-Case Manager [phone]  Patient Details  Name: [name] BullaMelinda Parthasarathy MRN: [id number] Date of Birth: 1980-04-13  Subjective/Objective:    Pt admitted s/p Emergency Repair of Acute Type A Aortic Dissection                Action/Plan: PTA pt lived at home- anticipate return home- CM to follow  Expected Discharge Date:                  Expected Discharge Plan:  Home/Self Care  In-House Referral:     Discharge planning Services  CM Consult  Post Acute Care Choice:    Choice offered to:     DME Arranged:    DME Agency:     HH Arranged:    HH Agency:     Status of Service:  In process, will continue to follow  Medicare Important Message Given:    Date Medicare IM Given:    Medicare IM give by:    Date Additional Medicare IM Given:    Additional Medicare Important Message give by:     If discussed at Long Length of Stay Meetings, dates discussed:    Additional Comments:  Darrold SpanWebster, Nielle Duford Hall, RN 04/23/2015, 3:48 PM

## 2015-04-23 NOTE — Progress Notes (Signed)
      [address]       [place] [id number]             [phone]        CARDIOTHORACIC SURGERY PROGRESS NOTE   R3 Days Post-Op Procedure(s) (LRB): VALVE-SPARING AORTIC ROOT REPLACEMENT USING A 26MM GELWEAVE VALSALVA GRAFT AND REIMPLANTATION OF RIGHT AND LEFT CORONARY ARTERIES  (N/A) REPAIR OF AORTIC DISSECTION WITH A 26MM HEMASHIELD GRAFT  Subjective: Looks good.  Feeling better.  Marginal appetite but eating a little.  Otherwise doing well.  Objective: Vital signs: BP Readings from Last 1 Encounters:  04/23/15 133/71   Pulse Readings from Last 1 Encounters:  04/23/15 75   Resp Readings from Last 1 Encounters:  04/23/15 17   Temp Readings from Last 1 Encounters:  04/23/15 98.4 F (36.9 C) Oral    Hemodynamics:    Physical Exam:  Rhythm:   sinus  Breath sounds: clear  Heart sounds:  RRR  Incisions:  Dressing dry, intact  Abdomen:  Soft, non-distended, non-tender  Extremities:  Warm, well-perfused   Intake/Output from previous day: 03/20 0701 - 03/21 0700 In: 540 [P.O.:540] Out: 1240 [Urine:1170; Chest Tube:70] Intake/Output this shift:    Lab Results:  CBC: Recent Labs  04/22/15 0503 04/23/15 0436  WBC 18.6* 13.1*  HGB 9.1* 8.5*  HCT 27.9* 26.2*  PLT 109* 97*    BMET:  Recent Labs  04/22/15 0503 04/23/15 0436  NA 135 136  K 3.9 3.5  CL 106 102  CO2 24 23  GLUCOSE 107* 100*  BUN 22* 25*  CREATININE 0.62 0.66  CALCIUM 7.9* 7.8*     PT/INR:   Recent Labs  04/20/15 1740  LABPROT 19.0*  INR 1.59*    CBG (last 3)   Recent Labs  04/22/15 0742 04/22/15 1139 04/22/15 1518  GLUCAP 109* 124* 126*    ABG    Component Value Date/Time   PHART 7.328* 04/21/2015 0637   PCO2ART 39.0 04/21/2015 0637   PO2ART 90.0 04/21/2015 0637   HCO3 20.3 04/21/2015 0637   TCO2 21 04/21/2015 1525   ACIDBASEDEF 5.0* 04/21/2015 0637   O2SAT 96.0 04/21/2015 0637    CXR: Looks good.  Clear. Minimal bibasilar  atelectasis  Assessment/Plan: S/P Procedure(s) (LRB): VALVE-SPARING AORTIC ROOT REPLACEMENT USING A 26MM GELWEAVE VALSALVA GRAFT AND REIMPLANTATION OF RIGHT AND LEFT CORONARY ARTERIES  (N/A) REPAIR OF AORTIC DISSECTION WITH A 26MM HEMASHIELD GRAFT  Doing very well POD3 Maintaining NSR w/ stable BP Hypertension - BP still slightly on the high side Breathing comfortably w/ O2 sats 98% on 2 L/min Expected post op acute blood loss anemia, mild, Hgb down slightly 8.5 Expected post op volume excess, mild Expected post op atelectasis, mild Post op thrombocytopenia, mild, platelet down slightly 97k Long-standing tobacco abuse 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

## 2015-04-23 NOTE — Progress Notes (Signed)
Utilization review completed.  

## 2015-04-24 LAB — TYPE AND SCREEN
ABO/RH(D): A POS
ANTIBODY SCREEN: NEGATIVE
UNIT DIVISION: 0
UNIT DIVISION: 0
UNIT DIVISION: 0
UNIT DIVISION: 0
Unit division: 0
Unit division: 0

## 2015-04-24 MED ORDER — LACTULOSE 10 GM/15ML PO SOLN
20.0000 g | Freq: Once | ORAL | Status: AC
  Administered 2015-04-24: 20 g via ORAL
  Filled 2015-04-24: qty 30

## 2015-04-24 MED ORDER — FUROSEMIDE 40 MG PO TABS
40.0000 mg | ORAL_TABLET | Freq: Every day | ORAL | Status: DC
  Administered 2015-04-24 – 2015-04-26 (×3): 40 mg via ORAL
  Filled 2015-04-24 (×4): qty 1

## 2015-04-24 MED ORDER — METOPROLOL TARTRATE 25 MG PO TABS
25.0000 mg | ORAL_TABLET | Freq: Two times a day (BID) | ORAL | Status: DC
  Administered 2015-04-25: 25 mg via ORAL
  Filled 2015-04-24: qty 1

## 2015-04-24 MED ORDER — FUROSEMIDE 40 MG PO TABS: 40.0000 mg | ORAL_TABLET | Freq: Every day | ORAL | Status: DC

## 2015-04-24 MED ORDER — FUROSEMIDE 10 MG/ML IJ SOLN
40.0000 mg | Freq: Once | INTRAMUSCULAR | Status: AC
  Administered 2015-04-24: 40 mg via INTRAVENOUS
  Filled 2015-04-24: qty 4

## 2015-04-24 MED ORDER — POTASSIUM CHLORIDE CRYS ER 20 MEQ PO TBCR
20.0000 meq | EXTENDED_RELEASE_TABLET | Freq: Once | ORAL | Status: AC
  Administered 2015-04-24: 20 meq via ORAL
  Filled 2015-04-24: qty 1

## 2015-04-24 NOTE — Progress Notes (Signed)
CARDIAC REHAB PHASE I   PRE:  Rate/Rhythm: 71 SR  BP:  Supine: 133/79  Sitting:   Standing:    SaO2: 95 RA  MODE:  Ambulation: 550 ft   POST:  Rate/Rhythm: 82 SR  BP:  Supine:   Sitting: 146/63  Standing:    SaO2: 93 RA [phone] On arrival pt c/o of cough and of feeling dizzy. Her RN in room with pt and states that she had taken BP lying, sitting and standing earlier and that they were normal. Encouraged pt to walk and get in recliner on return and that being up would improve the dizziness. Assisted X 1 and used walker to ambulate. Gait steady with walker. Pt c/o of feeling dizzy walking but has steady gait. She tires and took a couple of standing rest stops, but pushed her to walk. Got pt 550 feet. To recliner after walk with call light in reach. VS stable. Encouraged two more walks today, Using IS hourly and staying in recliner. Pt cooperative.  Melina CopaLisa Ahleah Simko RN 04/24/2015 10:36 AM

## 2015-04-24 NOTE — Progress Notes (Signed)
04/24/2015 6:15 PM Pt ambulated 43900ft with rolling walker on RA.  Pt tolerated well.  Did complain of legs feeling swollen after walk.  Explained to her keeping legs elevated would help reduce the swelling. Kathryne HitchAllen, Lorine Iannaccone C

## 2015-04-24 NOTE — Progress Notes (Signed)
DENTAL CONSULTATION  Date of Consultation:  04/24/2015 Patient Name:   [name] [name] Date of Birth:   01/07/1981 Medical Record Number: [id number]  VITALS: BP 153/63 mmHg  Pulse 78  Temp(Src) 98.2 F (36.8 C) (Oral)  Resp 18  Ht 5\' 6"  (1.676 m)  Wt 148 lb 1.6 oz (67.178 kg)  BMI 23.92 kg/m2  SpO2 90%  CHIEF COMPLAINT: Patient referred by Dr. Cornelius Moras for a dental consultation.  HPI: [name] [name] is a 35 year old [place] female recently diagnosed with acute aortic dissection. Patient is status post emergent repair of aortic dissection with valve-sparing aortic root replacement with Dr. Cornelius Moras. A dental consultation was requested to evaluate poor dentition and to schedule future comprehensive dental examination and treatment as indicated.  Patient currently denies acute toothaches, swellings, or abscesses. Patient indicates that she was last seen by a dentist "many years ago ". She does not have a regular primary dentist in  [place]. Patient denies having any partial dentures.  Patient knows that she has several "bad teeth". Patient denies having dental phobia.  PROBLEM LIST: Patient Active Problem List   Diagnosis Date Noted  . 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 Aortic dissection (HCC) 04/20/2015  . 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 Essential hypertension   . Anemia   . Tobacco abuse 04/19/2015  . Chest pain 04/19/2015  . Thoracic ascending aortic aneurysm (HCC) 04/19/2015  . NSTEMI (non-ST elevated myocardial infarction) (HCC) 04/19/2015  . D-dimer, elevated     PMH: Past Medical History  Diagnosis Date  . Tobacco abuse   . 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 Essential hypertension   . Anemia   . 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 Ascending aortic root replacement N/A 04/20/2015    Procedure: VALVE-SPARING AORTIC ROOT REPLACEMENT USING A GELWEAVE VALSALVA GRAFT AND REIMPLANTATION OF RIGHT AND LEFT CORONARY ARTERIES ;  Surgeon: Purcell Nails, MD;  Location: [facility] OR;  Service: Open Heart Surgery;  Laterality: N/A;  . Thoracic aortic aneurysm repair  04/20/2015    Procedure: REPAIR OF AORTIC DISSECTION WITH A HEMASHIELD GRAFT;  Surgeon: Purcell Nails, MD;  Location: [facility] OR;  Service: Open Heart Surgery;;    ALLERGIES: No Known Allergies  MEDICATIONS: Current Facility-Administered Medications  Medication Dose Route Frequency Provider Last Rate Last Dose  . 0.9 %  sodium chloride infusion  250 mL Intravenous PRN Purcell Nails, MD      . acetaminophen (TYLENOL) tablet 1,000 mg  1,000 mg Oral 4 times per day Purcell Nails, MD   1,000 mg at 04/24/15 0654  . aspirin EC tablet 325 mg  325 mg Oral Daily Purcell Nails, MD   325 mg at 04/23/15 1003  . bisacodyl (DULCOLAX) EC tablet 10 mg  10 mg Oral Daily Purcell Nails, MD   10 mg at 04/22/15 1156   Or  . bisacodyl (DULCOLAX) suppository 10 mg  10 mg Rectal Daily Purcell Nails, MD      . docusate sodium (COLACE) capsule 200 mg  200 mg Oral Daily Purcell Nails, MD   200 mg at 04/22/15 1157  . folic acid (FOLVITE) tablet 1 mg  1 mg Oral Daily Purcell Nails, MD   1 mg at 04/23/15 1003  . furosemide (LASIX) tablet 20 mg  20 mg Oral Daily Purcell Nails, MD   20 mg at 04/23/15 1003  . iron polysaccharides (NIFEREX) capsule 150 mg  150 mg Oral Daily Purcell Nails, MD   150 mg at 04/23/15 1003  . labetalol (NORMODYNE,TRANDATE) injection 10 mg  10 mg Intravenous Q2H PRN Purcell Nails, MD       . lactulose (CHRONULAC) 10 GM/15ML solution 20 g  20 g Oral Once Donielle M Zimmerman, PA-C      . levalbuterol [facility]) nebulizer solution 0.63 mg  0.63 mg Nebulization Q6H PRN Purcell Nails, MD      . metoprolol (LOPRESSOR) injection 2.5-5 mg  2.5-5 mg Intravenous Q2H PRN Purcell Nails, MD      . metoprolol tartrate (LOPRESSOR) tablet 25 mg  25 mg Oral TID Purcell Nails, MD   25 mg at 04/23/15 2115  . ondansetron (ZOFRAN) injection 4 mg  4 mg Intravenous Q6H PRN Purcell Nails, MD   4 mg at 04/23/15 2038  . oxyCODONE (Oxy IR/ROXICODONE) immediate release tablet 5-10 mg  5-10 mg Oral Q3H PRN Purcell Nails, MD   10 mg at 04/23/15 1250  . pantoprazole (PROTONIX) EC tablet 40 mg  40 mg Oral Daily Purcell Nails, MD   40 mg at 04/23/15 1003  . potassium chloride SA (K-DUR,KLOR-CON) CR tablet 20 mEq  20 mEq Oral Daily Purcell Nails, MD   0 mEq at 04/23/15 0907  . potassium chloride SA (K-DUR,KLOR-CON) CR tablet 20 mEq  20 mEq Oral Q4H PRN Purcell Nails, MD   20 mEq at 04/23/15 [phone]  . potassium chloride SA (K-DUR,KLOR-CON) CR tablet 20 mEq  20 mEq Oral Once Donielle M Zimmerman, PA-C      . sodium chloride flush (NS) 0.9 % injection 3 mL  3 mL Intravenous Q12H Purcell Nails, MD   3 mL at 04/23/15 2114  . sodium chloride flush (NS) 0.9 % injection 3 mL  3 mL Intravenous PRN Purcell Nails, MD      . traMADol Janean Sark) tablet 50-100 mg  50-100 mg Oral Q4H PRN Purcell Nails, MD   100 mg at 04/24/15 0207    LABS: Lab Results  Component Value Date   WBC 13.1* 04/23/2015   HGB 8.5* 04/23/2015   HCT 26.2* 04/23/2015   MCV 91.3 04/23/2015   PLT 97* 04/23/2015      Component Value Date/Time   NA 136 04/23/2015 0436   K 3.5 04/23/2015 0436   CL 102 04/23/2015 0436   CO2 23 04/23/2015 0436   GLUCOSE 100* 04/23/2015 0436   BUN 25* 04/23/2015 0436   CREATININE 0.66 04/23/2015 0436   CALCIUM 7.8* 04/23/2015 0436   GFRNONAA >60 04/23/2015 0436   GFRAA >60 04/23/2015 0436   Lab  Results  Component Value Date   INR 1.59* 04/20/2015   INR 1.14 04/19/2015   No results found for: PTT  SOCIAL HISTORY: Social History   Social History  . Marital Status: Married    Spouse Name: N/A  . Number of Children: N/A  . Years of Education: N/A   Occupational History  . Not on file.   Social History Main Topics  .  Smoking status: Current Every Day Smoker -- 0.10 packs/day for 20 years    Types: Cigarettes  . Smokeless tobacco: Not on file  . Alcohol Use: No  . Drug Use: Not on file  . Sexual Activity: Not on file   Other Topics Concern  . Not on file   Social History Narrative    FAMILY HISTORY: Family History  Problem Relation Age of Onset  . Aortic aneurysm Mother   . Coronary artery disease Mother   . Thyroid disease Mother   . Varicose Veins Mother     REVIEW OF SYSTEMS: Head and Neck: As per History of Present illness. Psych: Patient denies having dental phobia.  DENTAL HISTORY: CHIEF COMPLAINT: Patient referred by Dr. Cornelius Moraswen for a dental consultation.  HPI: [name] [name] is a 35 year old [place] female recently diagnosed with acute aortic dissection. Patient is status post emergent repair of aortic dissection with sparing aortic root replacement with Dr. Cornelius Moraswen. A dental consultation was requested to evaluate poor dentition and to schedule future comprehensive dental examination and treatment as indicated.  Patient currently denies acute toothaches, swellings, or abscesses. Patient indicates that she was last seen by a dentist "many years ago ". She does not [place]. Patient denies having any partial dentures.  Patient knows that she has several "bad teeth". Patient denies having dental phobia.  DENTAL EXAMINATION: GENERAL: The patient is a well-developed, well-nourished female in no acute distress. HEAD AND NECK: There is no palpable submandibular lymphadenopathy. There are no apparent thyroid masses.  Patient denies acute TMJ symptoms.  INTRAORAL EXAM:Patient has normal saliva. I do not see any evidence of oral abscess formation. Patient has bilateral mandibular lingual tori. DENTITION: Patient is missing tooth numbers 1, 16, 30, and 32. There are retained roots in the area of tooth numbers 13 and 17.  PERIODONTAL:Patient has chronic periodontitis with plaque and calculus accumulations, selective areas of gingival recession and no apparent grossly loose teeth.  DENTAL CARIES/SUBOPTIMAL RESTORATIONS:Multiple dental caries are noted. Patient will need a full series of dental radiographs to identify the extent of other dental caries.  ENDODONTIC:Patient currently denies acute pulpitis symptoms. The patient has had root canal therapy associated with tooth #29. There appears to be overfill of the root canal therapy of tooth #29. CROWN AND BRIDGE:There are no crown or bridge restorations noted.  PROSTHODONTIC:Patient denies having partial dentures.  OCCLUSION:Patient has a poor occlusal scheme secondary to multiple missing teeth, presence retained root segments, and lack of replacement of all missing teeth with dental prostheses.  RADIOGRAPHIC INTERPRETATION: An orthopantogram wastaken on 04/23/2015. This is suboptimal. There are multiple missing teeth. There are multiple retained root segments. There is incipient to moderate bone loss noted. There is supra-eruption and drifting of the unopposed teeth into the edentulous areas. Multiple dental caries are noted. There is previous root canal therapy associated with tooth #29 with possible overfill of the root canal therapy.   ASSESSMENTS: 1. History of acute aortic dissection 2. Status post emergent repair aortic dissection with valve-sparing aortic root replacement 3. Multiple retained root segments 4. Dental caries 5. Multiple missing teeth 6. Chronic periodontitis with bone loss 7. Accretions 8. Gingival recession 9. No history of acute pulpitis  symptoms 10. Need for full series of dental radiographs and a more thorough dental examination as an outpatient once she is medically stable. 11. Need for antibiotic premedication prior to invasive dental procedures due to history of aortic dissection repair and valve sparing  aortic root replacement.  PLAN/RECOMMENDATIONS: 1. I discussed the risks, benefits, and complications of various treatment options with the patient in relationship to her medical and dental conditions, previous surgery, and risk for dental infection affecting her previous surgery.   The patient will need to be seen as an outpatient for examination and full series of dental radiographs when she is medically stable.We will then discuss various treatment options to include no treatment, multiple extractions with alveoloplasty, pre-prosthetic surgery as indicated, periodontal therapy, dental restorations, root canal therapy, crown and bridge therapy, implant therapy, and replacement of missing teeth as indicated. An interpreter will be obtained for this appointment. The patient currently expresses understanding with this plan.   2. Discussion of findings with medical team and coordination of future medical and dental care as needed as an outpatient. Charlynne Pander, DDS

## 2015-04-24 NOTE — Progress Notes (Addendum)
      [address]       [place] [id number]             [phone]        4 Days Post-Op Procedure(s) (LRB): VALVE-SPARING AORTIC ROOT REPLACEMENT USING A 26MM GELWEAVE VALSALVA GRAFT AND REIMPLANTATION OF RIGHT AND LEFT CORONARY ARTERIES  (N/A) REPAIR OF AORTIC DISSECTION WITH A 26MM HEMASHIELD GRAFT  Subjective: Patient with dizziness and nausea this am.  Objective: Vital signs in last 24 hours: Temp:  [98.2 F (36.8 C)-98.8 F (37.1 C)] 98.2 F (36.8 C) (03/22 0431) Pulse Rate:  [69-82] 73 (03/22 0431) Cardiac Rhythm:  [-] Normal sinus rhythm (03/21 1900) Resp:  [13-20] 18 (03/22 0431) BP: (100-136)/(57-77) 120/57 mmHg (03/22 0431) SpO2:  [90 %-100 %] 90 % (03/22 0431) Weight:  [148 lb 1.6 oz (67.178 kg)] 148 lb 1.6 oz (67.178 kg) (03/22 0431)  Pre op weight 59 kg Current Weight  04/24/15 148 lb 1.6 oz (67.178 kg)      Intake/Output from previous day: 03/21 0701 - 03/22 0700 In: 843 [P.O.:840; I.V.:3] Out: 950 [Urine:950]   Physical Exam:  Cardiovascular: RRR, no murmur Pulmonary: Slightly diminished at bases Abdomen: Soft, non tender, bowel sounds present. Extremities: Mild bilateral lower extremity edema and feet warm. Wounds: Clean and dry.  No erythema or signs of infection.  Lab Results: CBC:  Recent Labs  04/22/15 0503 04/23/15 0436  WBC 18.6* 13.1*  HGB 9.1* 8.5*  HCT 27.9* 26.2*  PLT 109* 97*   BMET:   Recent Labs  04/22/15 0503 04/23/15 0436  NA 135 136  K 3.9 3.5  CL 106 102  CO2 24 23  GLUCOSE 107* 100*  BUN 22* 25*  CREATININE 0.62 0.66  CALCIUM 7.9* 7.8*    PT/INR:  Lab Results  Component Value Date   INR 1.59* 04/20/2015   INR 1.14 04/19/2015   ABG:  INR: Will add last result for INR, ABG once components are confirmed Will add last 4 CBG results once components are confirmed  Assessment/Plan:  1. CV - SR in the 60's this am. On Lopressor 25 mg tid. Will check orthostatic BPs. 2.  Pulmonary -  On room air. Encourage incentive spirometer. Will order CXR for am to follow up on small left apical pneumothorax 3. Volume Overload - On Lasix 20 mg daily 4.  Acute blood loss anemia - H and H yesterday 8.5 and 26.2. On Niferex. 5. Thrombocytopenia-last platelets 97,000 6. Supplement potassium 7. Orthopantogram results noted. Await dental evaluation 8. Nausea could be related to dizziness. Does not have abdominal pain. Unsure if has had bowel movement so will give laxative.  ZIMMERMAN,DONIELLE MPA-C 04/24/2015,8:18 AM  I have seen and examined the patient and agree with the assessment and plan as outlined.  Making steady progress.  Will cut metoprolol back to 25 mg bid since BP lower and patient reporting dizziness.  Needs more diuresis.  Extra dose lasix this evening.  Will plan f/u CTA of chest/abdomen/pelvis prior to hospital d/c  Purcell Nailslarence H Owen, MD 04/24/2015 5:26 PM

## 2015-04-25 ENCOUNTER — Inpatient Hospital Stay (HOSPITAL_COMMUNITY): Payer: Medicaid Other

## 2015-04-25 ENCOUNTER — Encounter (HOSPITAL_COMMUNITY): Payer: Self-pay | Admitting: Radiology

## 2015-04-25 LAB — BASIC METABOLIC PANEL
Anion gap: 9 (ref 5–15)
BUN: 15 mg/dL (ref 6–20)
CHLORIDE: 105 mmol/L (ref 101–111)
CO2: 22 mmol/L (ref 22–32)
CREATININE: 0.58 mg/dL (ref 0.44–1.00)
Calcium: 8 mg/dL — ABNORMAL LOW (ref 8.9–10.3)
GFR calc non Af Amer: >60 mL/min (ref >60)
Glucose, Bld: 101 mg/dL — ABNORMAL HIGH (ref 65–99)
POTASSIUM: 4.2 mmol/L (ref 3.5–5.1)
SODIUM: 136 mmol/L (ref 135–145)

## 2015-04-25 MED ORDER — POTASSIUM CHLORIDE CRYS ER 20 MEQ PO TBCR
40.0000 meq | EXTENDED_RELEASE_TABLET | Freq: Once | ORAL | Status: AC
  Administered 2015-04-25: 40 meq via ORAL
  Filled 2015-04-25: qty 2

## 2015-04-25 MED ORDER — IOHEXOL 350 MG/ML SOLN
100.0000 mL | Freq: Once | INTRAVENOUS | Status: AC | PRN
  Administered 2015-04-25: 100 mL via INTRAVENOUS

## 2015-04-25 MED ORDER — ASPIRIN EC 81 MG PO TBEC
81.0000 mg | DELAYED_RELEASE_TABLET | Freq: Every day | ORAL | Status: DC
  Administered 2015-04-26: 81 mg via ORAL
  Filled 2015-04-25: qty 1

## 2015-04-25 MED ORDER — METOPROLOL TARTRATE 25 MG PO TABS
25.0000 mg | ORAL_TABLET | Freq: Three times a day (TID) | ORAL | Status: DC
  Administered 2015-04-25 – 2015-04-26 (×2): 25 mg via ORAL
  Filled 2015-04-25 (×2): qty 1

## 2015-04-25 NOTE — Progress Notes (Signed)
TCTS BRIEF PROGRESS NOTE  5 Days Post-Op  S/P Procedure(s) (LRB): VALVE-SPARING AORTIC ROOT REPLACEMENT USING A 26MM GELWEAVE VALSALVA GRAFT AND REIMPLANTATION OF RIGHT AND LEFT CORONARY ARTERIES  (N/A) REPAIR OF AORTIC DISSECTION WITH A 26MM HEMASHIELD GRAFT   I have reviewed discharge instructions at length with the patient and her father, with her father interpreting.  She understands that she should never again smoke or use any nicotine.  She understands that she is not to drive a car until I see her in the office in 3-4 weeks.   She has been encouraged to walk as much as possible but avoid any sort of lifting, pulling or pushing with her arms or shoulders.  She may shower when she gets home but I have suggested that she use a shower chair initially.  Her father understands that she is to be accompanied by an adult 24 hours/day for at least the first week.  We discussed the implications of her residual aortic dissection and the possibility that she might have an underlying collagen vascular disease such as Marfan's Syndrome.  We will plan genetic testing at a later date.  All questions answered.  We tentatively plan d/c home tomorrow.  Will increase metoprolol back to 25 mg oral TID and decrease ASA to 81 mg/day.   Purcell Nailslarence H Owen, MD 04/25/2015 6:02 PM

## 2015-04-25 NOTE — Progress Notes (Addendum)
      [address]       [place] [id number]             [phone]      5 Days Post-Op Procedure(s) (LRB): VALVE-SPARING AORTIC ROOT REPLACEMENT USING A 26MM GELWEAVE VALSALVA GRAFT AND REIMPLANTATION OF RIGHT AND LEFT CORONARY ARTERIES  (N/A) REPAIR OF AORTIC DISSECTION WITH A 26MM HEMASHIELD GRAFT   Subjective:  [name] [name] continues to complain of dizziness.  She states nausea was improved.  Objective: Vital signs in last 24 hours: Temp:  [98.2 F (36.8 C)-98.6 F (37 C)] 98.3 F (36.8 C) (03/23 0500) Pulse Rate:  [63-78] 71 (03/23 0500) Cardiac Rhythm:  [-] Normal sinus rhythm (03/22 1900) Resp:  [16-18] 16 (03/23 0500) BP: (118-153)/(60-70) 148/65 mmHg (03/23 0500) SpO2:  [98 %-99 %] 98 % (03/23 0500) Weight:  [150 lb 5.7 oz (68.2 kg)] 150 lb 5.7 oz (68.2 kg) (03/23 0500)  Intake/Output from previous day: 03/22 0701 - 03/23 0700 In: 603 [P.O.:600; I.V.:3] Out: 2050 [Urine:2050]  General appearance: alert, cooperative and no distress Heart: regular rate and rhythm Lungs: clear to auscultation bilaterally Abdomen: soft, non-tender; bowel sounds normal; no masses,  no organomegaly Extremities: edema trace Wound: clean and dry  Lab Results:  Recent Labs  04/23/15 0436  WBC 13.1*  HGB 8.5*  HCT 26.2*  PLT 97*   BMET:  Recent Labs  04/23/15 0436  NA 136  K 3.5  CL 102  CO2 23  GLUCOSE 100*  BUN 25*  CREATININE 0.66  CALCIUM 7.8*    PT/INR: No results for input(s): LABPROT, INR in the last 72 hours. ABG    Component Value Date/Time   PHART 7.328* 04/21/2015 0637   HCO3 20.3 04/21/2015 0637   TCO2 21 04/21/2015 1525   ACIDBASEDEF 5.0* 04/21/2015 0637   O2SAT 96.0 04/21/2015 0637   CBG (last 3)   Recent Labs  04/22/15 1139 04/22/15 1518  GLUCAP 124* 126*    Assessment/Plan: S/P Procedure(s) (LRB): VALVE-SPARING AORTIC ROOT REPLACEMENT USING A 26MM GELWEAVE VALSALVA GRAFT AND REIMPLANTATION OF RIGHT AND LEFT CORONARY  ARTERIES  (N/A) REPAIR OF AORTIC DISSECTION WITH A 26MM HEMASHIELD GRAFT  1. CV- maintaining NSR, remains dizzy, does have orthostatic changes, however BP is high at times in the 150s, Lopressor was decreased to 25 mg BID yesterday 2. Pulm- no acute issues, off oxygen 3. Renal- creatinine WNL, remains hypervolemic by weight LE edema, not significant- continue Lasix for now 4. GI- nausea resolved 5. Dizziness- could be related to drop in BP, could try Meclizine to see if helps with dizziness 6. Dispo- patient stable, maintaining NSR, will d/c EPW today, continue diuresis, dizziness could be related to BP ? Possible vertigo, unsure if Meclizine would help   LOS: 5 days    [name], [name] 04/25/2015  I have seen and examined the patient and agree with the assessment and plan as outlined.  I suspect that dizziness is related to aortic dissection and do not favor adding Meclizine at this time.  Follow up CTA looks okay w/ expected involvement of aortic arch vessels and descending aorta with residual dissection.    Purcell Nailslarence H Amalee Olsen, MD 04/25/2015 5:32 PM

## 2015-04-25 NOTE — Progress Notes (Signed)
CARDIAC REHAB PHASE I   PRE:  Rate/Rhythm: 73 SR  BP:  Sitting: 142/53         SaO2: 94 RA  MODE:  Ambulation: 150 ft   POST:  Rate/Rhythm: 95 SR  BP:  Sitting: 149/65         SaO2: 97 RA  Pt in bed, still c/o dizziness, agreeable to walk. Pt ambulated 150 ft on RA, rolling walker, slow, steady gait, tolerated fair, pt c/o dizziness, states improved with walking, c/o general fatigue, VSS. Pt to recliner after walk, call bell within reach. Pt family at bedside, will plan to meet with pt and family at 6411 am tomorrow to complete discharge education with family interpreter. Encouraged additional ambulation x2 today, pt verbalized understanding. Will follow.   [phone] Joylene GrapesEmily C Dewie Ahart, RN, BSN 04/25/2015 11:19 AM

## 2015-04-25 NOTE — Discharge Summary (Signed)
Physician Discharge Summary       [address] [place]       [place] [address]             [phone]    Patient ID: [name] [name] MRN: [id number] DOB/AGE: 1980/10/20 35 y.o.  Admit date: 04/19/2015 Discharge date: 04/26/2015  Admission Diagnoses: 1. 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 Acute Type A Aortic Dissection  Active Diagnoses:  1. Tobacco abuse 2. Essential hypertension 3. Tobacco abuse 4. ABL anemia 5. Thrombocytopenia  Consults: cardiology  Procedure (s):   Emergency Repair of Acute Type A Aortic Dissection Right Axillary Artery Cannulation 26 mm Hemashield Platinum Replacement of Ascending Thoracic Aorta Hemi-arch Distal Anastamosis   Valve-Sparing Aortic Root Replacement Onalee Hua Procedure) 26 mm Gelweave Valsalva Aortic Root Graft Re-suspension (repair) of native aortic valve within proximal aortic root graft  Reimplantation of Left Main and Right Coronary Arteries by Dr. Cornelius Moras 04/20/2015.  History of Presenting Illness: This is a 35 year old female from [place] with history of hypertension and long-standing tobacco abuse but otherwise unremarkable past medical history who was in her usual state of health until Yesterday afternoon when she developed sudden onset of severe substernal chest pain radiating to the neck. 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 She was cleaning using Clorox bleach at the time and thought perhaps she had inhaled too much fumes from the bleach. She presented to the emergency department where she was found to have positive troponin but EKG was sinus rhythm and no acute ischemic changes. CT angiogram of the chest was performed to rule out pulmonary embolus. It was reported to be negative for pulmonary embolus. The scan did reveal a 5.2 x 4.8 centimeter aneurysm of the proximal  aorta. According to the interpreting radiologist there was no sign of aortic dissection. The patient was admitted to the hospitalist service, started on intravenous heparin and placed on protocol for presumed acute coronary syndrome. Elective cardiothoracic surgical consultation for the following morning was requested because of the presence of aneurysmal the proximal aorta.  The patient is married and recently moved from [place] to the [place] approximately 6 months ago. She does not speak any [place]. Her husband and father are present for consultation. The father speaks [place] and translates for the patient. The husband does not speak [place]. The patient describes a consistent story of acute onset severe substernal chest pain radiating to the neck that began yesterday afternoon. The pain has persisted ever since and not dissipated. The patient reportedly had no previous history of similar symptoms of chest discomfort in the past. She denies any previous history of exertional chest pain or shortness of breath. She has reported to be essentially healthy all of her life. She was evaluated by a physician once in the distant past because of a mild pectus carinatum deformity. She has never had any surgery of any kind. She admits to known history of mild hypertension but has not been on medical therapy. She has long-standing history of tobacco abuse smoking at least 1 pack of cigarettes daily. She denies any known history of heart murmur in the past. She denies any visual disturbances, headaches, numbness or weakness involving either upper or lower extremity. Patient has acute type A aortic dissection with aneurysmal enlargement of the aortic root. The patient's physical appearance and anatomical characteristics of the aortic root aneurysm might be consistent with some type of underlying collagen vascular  disease such as Marfan's syndrome. She underwent emergent repair of Type A aortic dissection with  valve sparing aortic root enlargement on 04/20/2015.  Brief Hospital Course:  The patient was extubated early the morning of surgery without difficulty. She remained afebrile and hemodynamically stable. [name], a line, chest tubes, and foley were removed early in the post operative course. Lopressor was started and titrated accordingly. She was volume over loaded and diuresed. She had ABL anemia. She did not require a post op transfusion. She was put on Niferex and folic acid. Her last H and H was 8.5 and 26.2. She also had mild thrombocytopenia. Her last platelet count was 97,000. She was weaned off the insulin drip. The patient's HGA1C pre op 5.4. She has a history of tobacco abuse. Smoking cessation counseling was done. She did have some nausea and dizziness post op. Nausea did improve. Her dizziness was likely related to her blood pressure. She was living with high blood pressure which is now better controlled. The patient was felt surgically stable for transfer from the ICU to [facility] for further convalescence on 03/21//2017. She continues to progress with cardiac rehab. She initially required 3 liters of oxygen via . She has been weaned to room air. An orthopanogram and dental consult was obtained with Dr. Robin Searing. She will require future dental treatment after she recovers from surgery.  She has been tolerating a diet and has had a bowel movement. Epicardial pacing wires were removed on 03/23.  Chest tube sutures will be removed prior to discharge. The patient is felt surgically stable for discharge today.   Latest Vital Signs: Blood pressure 146/72, pulse 81, temperature 98.5 F (36.9 C), temperature source Oral, resp. rate 20, height  (1.676 m), weight 150 lb 5.7 oz (68.2 kg), SpO2 97 %.  Physical Exam: Cardiovascular: RRR, no murmur Pulmonary: Mostly clear Abdomen: Soft, non tender, bowel sounds present. Extremities: Mild bilateral lower extremity edema and ++ feet warm and pulses  intact Wounds: Clean and dry. No erythema or signs of infection.  Discharge Condition: Stable for discharge home.  Recent laboratory studies:  Lab Results  Component Value Date   WBC 13.1* 04/23/2015   HGB 8.5* 04/23/2015   HCT 26.2* 04/23/2015   MCV 91.3 04/23/2015   PLT 97* 04/23/2015   Lab Results  Component Value Date   NA 136 04/26/2015   K 4.7 04/26/2015   CL 106 04/26/2015   CO2 23 04/26/2015   CREATININE 0.63 04/26/2015   GLUCOSE 93 04/26/2015    Diagnostic Studies:  EXAM: CT ANGIOGRAPHY CHEST AND ABDOMEN  TECHNIQUE: Multidetector CT imaging of the chest and abdomen was performed using the standard protocol during bolus administration of intravenous contrast. Multiplanar CT image reconstructions and MIPs were obtained to evaluate the vascular anatomy.  CONTRAST: OMNIPAQUE IOHEXOL 350 MG/ML SOLN  COMPARISON: 04/20/2015 and 04/19/2015  FINDINGS: CTA CHEST FINDINGS  Median sternotomy wires are intact. Minimal subcutaneous air over the left lower anterior chest and upper chest/neck base. There is a small left apical pneumothorax small to moderate bilateral pleural fluid collections right slightly greater than left which are worse. There is associated bibasilar atelectasis and mild dependent density over the posterior left upper lobe likely atelectasis. Airways are within normal.  Mild stable cardiomegaly. Small amount of pericardial fluid over the right heart border slightly more pronounced.  Evidence of patient's repair of type A aortic dissection as the opacified aortic root, sinotubular junction and ascending aorta are normal in caliber without  evidence of dissection flap likely excluded by graft. Dissection flap again seen at the level of the aortic arch and continues throughout the descending thoracic aorta as the true lumen is smaller than the false lumen and unchanged from the prior exam. Stable to slightly less  mediastinal fluid/hemorrhage. The dissection flap extends into the great vessels only involving the origin/proximal segment of the left subclavian and right brachiocephalic arteries. Dissection extends into the left common carotid artery as the previously narrowed true lumen is now normal in caliber with thrombus within the false lumen to the level of the thyroid gland. Remaining mediastinal structures are unchanged.  1.5 cm left thyroid nodule unchanged.  Review of the MIP images confirms the above findings.  CTA ABDOMEN FINDINGS  Dissection flap continues into the abdominal aorta as true lumen remains smaller than the false lumen. The flap extends to the bifurcation involving the origin of the visualize common iliac arteries. Abdominal aorta is otherwise normal in caliber. Celiac axis and superior mesenteric artery arise from the true lumen as does the inferior mesenteric artery which are normally opacified. Single bilateral renal arteries arise normally from the anteriorly located false lumen as there is normal opacification of the kidneys.  Couple sub cm hyperdense foci over the inferior right lobe of the liver on these arterial phase images likely arterial venous shunting. Remainder of the liver, gallbladder, spleen, pancreas and adrenal glands are normal. Stomach is normal.  Kidneys are within normal.  Visualized bowel and mesentery are within normal.  No free fluid or free peritoneal air. Minimal subcutaneous air over the left upper abdomen. Mild diffuse subcutaneous edema.  Remaining bony structures are unremarkable.  Review of the MIP images confirms the above findings.  IMPRESSION: Evidence of recent graft repair of type A aortic dissection as described with graft exclusion of the flap involving the sino-tubular junction and ascending aorta. No significant overall change in small amount of mediastinal/pericardial fluid/hemorrhage. Dissection flap again seen  beginning at the aortic arch extending distally into the abdominal aorta to the aortic bifurcation and visualized common iliac arteries as this is stable. Dissection extends into the great vessels as described as there has been interval improvement with normal opacified caliber of the true lumen of the left common carotid artery. No solid organ perfusion abnormalities within the abdomen as solid organs opacified from the smaller anteriorly located true lumen.  Small left apical pneumothorax.  Small to moderate bilateral pleural effusions right greater than left with associated bibasilar and posterior left upper lobe atelectasis. Stable cardiomegaly.  Stable 1.5 cm left thyroid nodule. Recommend further evaluation with thyroid ultrasound on an elective basis.   Electronically Signed  By: Elberta Fortisaniel Boyle M.D.  On: 04/25/2015 12:29   Dg Orthopantogram  04/23/2015  CLINICAL DATA:  Poor dentition. EXAM: ORTHOPANTOGRAM/PANORAMIC COMPARISON:  No prior. FINDINGS: ScratchedChanges consistent with dental caries noted bilaterally about the lower molars. Lucency noted of the right mandibular angle, this may be related technique common to exclude a focal lesion maxillofacial CT suggested. IMPRESSION: 1.  Bilateral lower molar dental caries. 2. Lucency noted of the right mandible at the mandibular angle, possibly related to technique. To exclude a focal lesion maxillofacial CT suggested . Electronically Signed   By: Maisie Fushomas  Register   On: 04/23/2015 08:00   Dg Chest 2 View  04/23/2015  CLINICAL DATA:  Status post repair of thoracic aortic aneurysm with aortic root replacement EXAM: CHEST  2 VIEW COMPARISON:  Portable chest x-ray of April 22, 2015 FINDINGS: The  lungs are mildly hyperinflated. There is a 5 x 10% left apical pneumothorax slightly more conspicuous today. The chest tubes and mediastinal drain have been removed. There are small pleural effusions layering posteriorly, bilaterally.  Subsegmental atelectasis at both lung bases has improved. The cardiac silhouette is normal in size. There is mild prominence of the ascending thoracic aorta and aortic arch. The sternal wires are intact. IMPRESSION: Interval removal of the chest tubes and mediastinal drains. A small left apical pneumothorax persists amounting to between 5 and 10% of the lung volume. No right-sided pneumothorax is evident. There is no pulmonary edema. Electronically Signed   By: David  [place] M.D.   On: 04/23/2015 07:50   Ct Angio Chest Pe W/cm &/or Wo Cm  04/19/2015  CLINICAL DATA:  Shortness of breath and chest pain EXAM: CT ANGIOGRAPHY CHEST WITH CONTRAST TECHNIQUE: Multidetector CT imaging of the chest was performed using the standard protocol during bolus administration of intravenous contrast. Multiplanar CT image reconstructions and MIPs were obtained to evaluate the vascular anatomy. CONTRAST:  OMNIPAQUE IOHEXOL 350 MG/ML SOLN COMPARISON:  Chest radiograph April 19, 2015 FINDINGS: Mediastinum/Lymph Nodes: There is no demonstrable pulmonary embolus. There is dilatation of the proximal ascending thoracic aorta with a transverse diameter of 5.2 x 4.8 cm. The visualized great vessels appear unremarkable. Pericardium is not appreciably thickened. Thyroid appears unremarkable. There is no demonstrable thoracic adenopathy. Lungs/Pleura: There is slight scarring in the right lung base region. There are a few scattered small bullae in the upper lobes. There is upper and lower lobe bronchiectatic change. There is no parenchymal lung edema or consolidation. Upper abdomen: There is reflux of contrast into the aorta and hepatic veins. The visualized upper abdominal structures otherwise appear unremarkable. Musculoskeletal: There are no blastic or lytic bone lesions. There is asymmetric breast tissue in the lateral right breast compared to the left. Review of the MIP images confirms the above findings. IMPRESSION: No demonstrable  pulmonary embolus. There is dilatation of the ascending thoracic aorta with a transverse diameter of 5.2 x 4.8 cm. Ascending thoracic aortic aneurysm. Recommend semi-annual imaging followup by CTA or MRA and referral to cardiothoracic surgery if not already obtained. This recommendation follows 2010 ACCF/AHA/AATS/ACR/ASA/SCA/SCAI/SIR/STS/SVM Guidelines for the Diagnosis and Management of Patients With Thoracic Aortic Disease. Circulation. 2010; 121: Z610-R604. Suspect aortic stenosis given this degree of proximal ascending thoracic aortic dilatation. No edema or consolidation. Mild upper and lower lobe bronchiectatic change bilaterally. No adenopathy apparent. Reflux of contrast into the inferior vena cava and hepatic veins. Suspect a degree of increased right heart pressure. Asymmetric breast tissue lateral right breast compared to the left. This finding warrants physical examination. Consider breast ultrasound if palpable abnormality is noted in the lateral right breast. Electronically Signed   By: Bretta Bang III M.D.   On: 04/19/2015 18:39   Ct Angio Chest Aorta W/cm &/or Wo/cm  04/20/2015  CLINICAL DATA:  Evaluate for dissection.  Chest pain. EXAM: CT ANGIOGRAPHY CHEST WITH CONTRAST TECHNIQUE: Multidetector CT imaging of the chest was performed using the standard protocol during bolus administration of intravenous contrast. Multiplanar CT image reconstructions and MIPs were obtained to evaluate the vascular anatomy. CONTRAST:  OMNIPAQUE IOHEXOL 350 MG/ML SOLN COMPARISON:  CT scan April 19, 2015 FINDINGS: There is a thoracic aortic dissection extending from the root of the aorta, at the level of the aortic valves, through the arch and descending thoracic aorta into the upper abodminal aorta. The remainder of the abdominal aorta was not included  on this study. There is significant motion near the heart making it difficult to confidently determine the true versus false lumen. However, I believe the  true lumen is the more central smaller lumen. The dissection extends into the proximal right brachiocephalic artery and the left common carotid artery. The left common carotid artery is partially thrombosed and significantly narrowed in the neck. The dissection also extends into the proximal aspect of the left subclavian artery. The dissection also extended into the root of the superior mesenteric artery. I see no definitive extension into the celiac artery. The thoracic aorta is aneurysmal at its origin measuring 5.7 x 5.1 cm. It is difficult to evaluate the origins of the coronary arteries on this study. The study was not tailored to evaluate pulmonary arteries but no emboli are seen. No pleural effusions are noted. No active extravasation from the dissecting aorta is identified. However, there is high attenuation fluid in the mediastinum which is increased since yesterday consistent with mediastinal hematoma. No adenopathy. Prominence of the lateral right breast tissue as described yesterday could be further assessed with mammography. Secretions seen in right lower lobe airways. Central airways are normal. Bibasilar atelectasis is identified. No pulmonary nodules, masses, or focal infiltrates. Evaluation of the upper abdomen demonstrates no other abnormalities. Visualized bones are normal. Review of the MIP images confirms the above findings. IMPRESSION: 1. 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 An abdominal CTA was not performed to completely evaluate the abdominal aorta. The dissection extends into the proximal right brachiocephalic artery, left common carotid artery, and left subclavian artery. It likely extends superiorly into the left common carotid artery which is significant narrowed with thrombus within the neck. The dissection also extends into the root of the superior mesenteric artery with apparent sparing of this celiac artery. The true  lumen appears to be the smaller more central lumen and the vessels described above all appear to arise from the true lumen. 2. Increasing high attenuation fluid in the mediastinum consistent with hematoma. The findings were called by myself to Dr. Cornelius Moras at 8:45 a.m. Electronically Signed   By: Gerome Sam III M.D   On: 04/20/2015 09:10    Discharge Medications:   Medication List    TAKE these medications        acetaminophen 500 MG tablet  Commonly known as:  TYLENOL  Take 500 mg by mouth every 6 (six) hours as needed for mild pain.     aspirin 81 MG EC tablet  Take 1 tablet (81 mg total) by mouth daily.     folic acid 1 MG tablet  Commonly known as:  FOLVITE  Take 1 tablet (1 mg total) by mouth daily. For one month then stop.     furosemide 40 MG tablet  Commonly known as:  LASIX  Take 1 tablet (40 mg total) by mouth daily. For 2 weeks then stop.     iron polysaccharides 150 MG capsule  Commonly known as:  NIFEREX  Take 1 capsule (150 mg total) by mouth daily. For one month then stop.     metoprolol tartrate 25 MG tablet  Commonly known as:  LOPRESSOR  Take 1 tablet (25 mg total) by mouth 3 (three) times daily.     traMADol 50 MG tablet  Commonly known as:  ULTRAM  Take 1-2 tablets (50-100 mg total) by mouth every 4 (four) hours as needed for moderate pain.  The patient has been discharged on:   1.Beta Blocker:  Yes [ x  ]                              No   [   ]                              If No, reason:  2.Ace Inhibitor/ARB: Yes [   ]                                     No  [   x ]                                     If No, reason:Labile blood pressure but hope to start after discharge  3.Statin:   Yes [   ]                  No  [ x  ]                  If No, reason:No CAD  4.Marlowe Kays:  Yes  [ x  ]                  No   [   ]                  If No, reason:  Follow Up Appointments: Follow-up Information    Follow up with Purcell Nails, MD On  05/20/2015.   Specialty:  Cardiothoracic Surgery   Why:  PA/LAT CXR to be taken (at [facility] which is in the same building as Dr. Orvan July office) on 05/20/2015 at 2:15 pm;Appointment time is at 3:00 pm   Contact information:   [address] [place] [address] [phone]       Follow up with Chrystie Nose, MD On 05/07/2015.   Specialty:  Cardiology   Why:  Appointment time is at 8:30 am   Contact information:   [address] [place] [address] [place] [address] [phone]       Follow up with Cindra Eves, DDS.   Specialty:  Dentistry   Why:  Call for a follow up appointment after seen by Dr. Cornelius Moras on 05/20/2015   Contact information:   [address] [place] [address] [phone]       Follow up with [facility] On 05/01/2015.   Why:  f/u to establish primary care - appointment at 3:30   Contact information:   [address] [place] [address] [phone]      Signed: Doree Fudge MPA-C 04/26/2015, 10:32 AM

## 2015-04-26 LAB — BASIC METABOLIC PANEL WITH GFR
Anion gap: 7 (ref 5–15)
BUN: 11 mg/dL (ref 6–20)
CO2: 23 mmol/L (ref 22–32)
Calcium: 8.2 mg/dL — ABNORMAL LOW (ref 8.9–10.3)
Chloride: 106 mmol/L (ref 101–111)
Creatinine, Ser: 0.63 mg/dL (ref 0.44–1.00)
GFR calc Af Amer: >60 mL/min
GFR calc non Af Amer: >60 mL/min
Glucose, Bld: 93 mg/dL (ref 65–99)
Potassium: 4.7 mmol/L (ref 3.5–5.1)
Sodium: 136 mmol/L (ref 135–145)

## 2015-04-26 MED ORDER — TRAMADOL HCL 50 MG PO TABS: 50.0000 mg | ORAL_TABLET | ORAL | Status: DC | PRN

## 2015-04-26 MED ORDER — POLYSACCHARIDE IRON COMPLEX 150 MG PO CAPS: 150.0000 mg | ORAL_CAPSULE | Freq: Every day | ORAL | Status: DC

## 2015-04-26 MED ORDER — FOLIC ACID 1 MG PO TABS: 1.0000 mg | ORAL_TABLET | Freq: Every day | ORAL | Status: DC

## 2015-04-26 MED ORDER — METOPROLOL TARTRATE 25 MG PO TABS: 25.0000 mg | ORAL_TABLET | Freq: Three times a day (TID) | ORAL | Status: AC

## 2015-04-26 MED ORDER — FUROSEMIDE 40 MG PO TABS: 40.0000 mg | ORAL_TABLET | Freq: Every day | ORAL | Status: DC

## 2015-04-26 MED ORDER — ASPIRIN 81 MG PO TBEC: 81.0000 mg | DELAYED_RELEASE_TABLET | Freq: Every day | ORAL | Status: AC

## 2015-04-26 NOTE — Progress Notes (Addendum)
      [address]       [place] [id number]             [phone]        6 Days Post-Op Procedure(s) (LRB): VALVE-SPARING AORTIC ROOT REPLACEMENT USING A 26MM GELWEAVE VALSALVA GRAFT AND REIMPLANTATION OF RIGHT AND LEFT CORONARY ARTERIES  (N/A) REPAIR OF AORTIC DISSECTION WITH A 26MM HEMASHIELD GRAFT  Subjective: Patient still has occasional nausea and dizziness.  Objective: Vital signs in last 24 hours: Temp:  [98.5 F (36.9 C)-99.3 F (37.4 C)] 98.5 F (36.9 C) (03/24 0722) Pulse Rate:  [69-84] 81 (03/24 0722) Cardiac Rhythm:  [-] Normal sinus rhythm (03/23 1900) Resp:  [16-20] 20 (03/24 0722) BP: (130-149)/(53-79) 146/72 mmHg (03/24 0722) SpO2:  [94 %-99 %] 97 % (03/24 0722)  Pre op weight 59 kg Current Weight  04/25/15 150 lb 5.7 oz (68.2 kg)      Intake/Output from previous day: 03/23 0701 - 03/24 0700 In: 462 [P.O.:462] Out: -    Physical Exam:  Cardiovascular: RRR, no murmur Pulmonary: Mostly clear Abdomen: Soft, non tender, bowel sounds present. Extremities: Mild bilateral lower extremity edema and ++ feet warm and pulses intact Wounds: Clean and dry.  No erythema or signs of infection.  Lab Results: CBC: No results for input(s): WBC, HGB, HCT, PLT in the last 72 hours. BMET:   Recent Labs  04/25/15 0849 04/26/15 0350  NA 136 136  K 4.2 4.7  CL 105 106  CO2 22 23  GLUCOSE 101* 93  BUN 15 11  CREATININE 0.58 0.63  CALCIUM 8.0* 8.2*    PT/INR:  Lab Results  Component Value Date   INR 1.59* 04/20/2015   INR 1.14 04/19/2015   ABG:  INR: Will add last result for INR, ABG once components are confirmed Will add last 4 CBG results once components are confirmed  Assessment/Plan:  1. CV - SR in the 80's this am. On Lopressor 25 mg tid.CTA results noted (Dissection flap continues into the abdominal aorta as true lumen remains smaller than the false lumen. The flap extends to the bifurcation involving the origin of the  visualize common iliac arteries. Abdominal aorta is otherwise normal in caliber. Celiac axis and superior mesenteric artery arise from the true lumen as does the inferior mesenteric artery which are normally opacified. Single bilateral renal arteries arise normally from the anteriorly located false lumen as there is normal opacification of the kidneys.) 2.  Pulmonary - On room air. Encourage incentive spirometer.  3. Volume Overload - On Lasix 40 mg daily. She will need to continue after discharge. 4.  Acute blood loss anemia - H and H yesterday 8.5 and 26.2. On Niferex. 5. Thrombocytopenia-last platelets 97,000 6. Likely discharge later today  ZIMMERMAN,DONIELLE MPA-C 04/26/2015,7:27 AM   I have seen and examined the patient and agree with the assessment and plan as outlined.  Purcell Nailslarence H Owen, MD 04/26/2015 10:20 AM

## 2015-04-26 NOTE — Discharge Instructions (Signed)
Activity: 1.May walk up steps                2.No lifting more than ten pounds for four weeks.                 3.No driving for four weeks.                4.Stop any activity that causes chest pain, shortness of breath, dizziness, sweating or excessive weakness.                5.Avoid straining and no strenuous physical activity until instructed otherwise.                6.Continue with your breathing exercises daily.  Diet: Low fat, Low salt diet  Wound Care: May shower.  Clean wounds with mild soap and water daily. Contact the office at [phone] if any problems arise.

## 2015-04-26 NOTE — Progress Notes (Signed)
CARDIAC REHAB PHASE I   Cardiac surgery discharge education completed with pt, husband and father who interprets for patient at bedside. Reviewed risk factors, tobacco cessation (gave pt fake cigarette), IS, sternal precautions, activity progression, exercise, daily weights, sodium restrictions, heart healthy diet, and phase 2 cardiac rehab. Pt and family verbalized understanding. Pt agrees to phase 2 cardiac rehab referral, [facility]. Gave pt financial assistance form as pt does not have insurance. Pt to recliner after walk, call bell within reach, awaiting discharge.  [phone] Joylene GrapesEmily C Tamikia Chowning, RN, BSN 04/26/2015 11:42 AM

## 2015-04-26 NOTE — Progress Notes (Signed)
Pt has been discharged home with family. Pt's abdominal sutures were removed and steri-strips with bezoin were applied. Pt tolerated suture removal well. Pt's IV was also removed with no complications. Pt and pt's family received discharge instructions and all questions were answered. Pt left the floor via wheelchair and was [facility] by [facility] and her husband. Pt was in no distress at time of discharge.   Berdine DanceLauren Moffitt RN, BSN

## 2015-04-26 NOTE — Care Management Note (Signed)
Case Management Note Donn PieriniKristi Hailey Miles RN, BSN Unit 2W-Case Manager [phone]  Patient Details  Name: Gunnar BullaMelinda Henningsen MRN: [id number] Date of Birth: 29-Jan-1981  Subjective/Objective:    Pt admitted s/p Emergency Repair of Acute Type A Aortic Dissection                Action/Plan: PTA pt lived at home- anticipate return home- CM to follow  Expected Discharge Date:   04/26/15               Expected Discharge Plan:  Home/Self Care  In-House Referral:     Discharge planning Services  CM Consult, [facility], Follow-up appt scheduled  Post Acute Care Choice:  NA Choice offered to:     DME Arranged:    DME Agency:     HH Arranged:    HH Agency:     Status of Service:  Completed, signed off  Medicare Important Message Given:    Date Medicare IM Given:    Medicare IM give by:    Date Additional Medicare IM Given:    Additional Medicare Important Message give by:     If discussed at Long Length of Stay Meetings, dates discussed:  04/25/15  Discharge Disposition: home/self care   Additional Comments:  04/26/15- 1200- Donn PieriniKristi Melquiades Kovar RN, BSN - pt for d/c home today with family- pt did not have PCP - [facility] March [facility] at 3:30 to establish care with clinic for PCP needs- pt also will have f/u with surgery and cards.  Have given pt info on clinic and explained through family interpreter the clinic and its services- Have looked at pt's medications and they are generic with no high cost drugs- per family they already have asa and iron at home- [facility] not need to [facility] feels like they will be able to manage getting medications plan to use [facility]- explained they can also [facility].   Darrold SpanWebster, Taliah Porche Hall, RN 04/26/2015, 11:54 AM

## 2015-05-01 ENCOUNTER — Ambulatory Visit: Payer: Self-pay | Attending: Internal Medicine | Admitting: Internal Medicine

## 2015-05-01 ENCOUNTER — Encounter: Payer: Self-pay | Admitting: Internal Medicine

## 2015-05-01 VITALS — BP 147/78 | HR 91 | Temp 98.1°F | Resp 18 | Ht 69.0 in | Wt 126.0 lb

## 2015-05-01 DIAGNOSIS — Z9889 Other specified postprocedural states: Secondary | ICD-10-CM | POA: Insufficient documentation

## 2015-05-01 DIAGNOSIS — I10 Essential (primary) hypertension: Secondary | ICD-10-CM | POA: Insufficient documentation

## 2015-05-01 DIAGNOSIS — Z79899 Other long term (current) drug therapy: Secondary | ICD-10-CM | POA: Insufficient documentation

## 2015-05-01 DIAGNOSIS — R0789 Other chest pain: Secondary | ICD-10-CM | POA: Insufficient documentation

## 2015-05-01 DIAGNOSIS — Z7982 Long term (current) use of aspirin: Secondary | ICD-10-CM | POA: Insufficient documentation

## 2015-05-01 DIAGNOSIS — F17210 Nicotine dependence, cigarettes, uncomplicated: Secondary | ICD-10-CM | POA: Insufficient documentation

## 2015-05-01 DIAGNOSIS — R634 Abnormal weight loss: Secondary | ICD-10-CM

## 2015-05-01 DIAGNOSIS — I7100 Dissection of unspecified site of aorta: Secondary | ICD-10-CM | POA: Insufficient documentation

## 2015-05-01 NOTE — Progress Notes (Signed)
Patient comes in for follow-up after having aortic root replaced. She complains of continued chest pain around the surgical site. She also notes diffuse pains elsewhere. She states that she had one episode of a temperature of 37.7C. That was associated with sweating. That has resolved. She has generally felt weak.  Please note this history is obtained with her father present we were unable to get a [place] interpreter. Her father acted as Equities trader.  Past Medical History  Diagnosis Date  . Tobacco abuse   . 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 Essential hypertension   . Anemia   . S/P repair of acute aortic dissection and valve-sparing root replacement with aortic valve repair 04/20/2015    26 mm Hemashield graft replacement of ascending thoracic aorta with hemi-arch distal anastamosis and 26 mm Gelweave Valsava aortic root graft for David Procedure valve-sparing aortic root replacement and reimplantation of left main and right coronary arteries     Social History   Social History  . Marital Status: Married    Spouse Name: N/A  . Number of Children: N/A  . Years of Education: N/A   Occupational History  . Not on file.   Social History Main Topics  . Smoking status: Current Every Day Smoker -- 0.10 packs/day for 20 years    Types: Cigarettes  . Smokeless tobacco: Not on file  . Alcohol Use: No  . Drug Use: Not on file  . Sexual Activity: Not on file   Other Topics Concern  . Not on file   Social History Narrative    Past Surgical History  Procedure Laterality Date  . Ascending aortic root replacement N/A 04/20/2015    Procedure: VALVE-SPARING AORTIC ROOT REPLACEMENT USING A GELWEAVE VALSALVA GRAFT AND REIMPLANTATION OF RIGHT AND LEFT CORONARY ARTERIES ;  Surgeon: Purcell Nails, MD;  Location: [facility] OR;  Service: Open Heart Surgery;  Laterality: N/A;  . Thoracic aortic aneurysm repair  04/20/2015    Procedure: REPAIR OF AORTIC DISSECTION WITH A  HEMASHIELD GRAFT;  Surgeon: Purcell Nails, MD;  Location: [facility] OR;  Service: Open Heart Surgery;;    Family History  Problem Relation Age of Onset  . Aortic aneurysm Mother   . Coronary artery disease Mother   . Thyroid disease Mother   . Varicose Veins Mother     No Known Allergies  Current Outpatient Prescriptions on File Prior to Visit  Medication Sig Dispense Refill  . acetaminophen (TYLENOL) 500 MG tablet Take 500 mg by mouth every 6 (six) hours as needed for mild pain.    Marland Kitchen aspirin EC 81 MG EC tablet Take 1 tablet (81 mg total) by mouth daily.    . folic acid (FOLVITE) 1 MG tablet Take 1 tablet (1 mg total) by mouth daily. For one month then stop.    . furosemide (LASIX) 40 MG tablet Take 1 tablet (40 mg total) by mouth daily. For 2 weeks then stop. 14 tablet 0  . iron polysaccharides (NIFEREX) 150 MG capsule Take 1 capsule (150 mg total) by mouth daily. For one month then stop. 30 capsule 1  . metoprolol tartrate (LOPRESSOR) 25 MG tablet Take 1 tablet (25 mg total) by mouth 3 (three) times daily. 90 tablet 1  . traMADol (ULTRAM) 50 MG tablet Take 1-2 tablets (50-100 mg total) by mouth every 4 (four) hours as needed for moderate pain. 30 tablet 0   No current facility-administered medications on file prior to visit.  patient denies chest pain, shortness of breath, orthopnea. Denies lower extremity edema, abdominal pain, change in appetite, change in bowel movements. Patient denies rashes, musculoskeletal complaints. No other specific complaints in a complete review of systems.   BP 147/78 mmHg  Pulse 91  Temp(Src) 98.1 F (36.7 C) (Oral)  Resp 18  Ht 5\' 9"  (1.753 m)  Wt 126 lb (57.153 kg)  BMI 18.60 kg/m2  SpO2 100% Young female in no acute distress. HEENT exam atraumatic, normocephalic, obstructive muscles are intact. Neck is supple without lymphadenopathy or jugular venous distention. Chest is clear to auscultation. Cardiac exam S1 and S2 are regular. She has a  midsternal scar. She also has a left-sided anterior chest surgical wound. Abdominal exam thin, active bowel sounds, soft. Extremities with trace edema pretibially.  Assessment and plan: She is status post aortic root surgery. She is recovering. She actually looks quite well. I reviewed her lab tests. I don't think we needed to repeat any laboratory work now. I don't think we need to repeat a chest x-ray. I've asked her to be more conscientious about calorie consumption. I've also asked them to be mindful if she has recurrent or persistent fevers that that will need further evaluation. I've asked her to come back here in 2 weeks.

## 2015-05-01 NOTE — Progress Notes (Signed)
Patient is here for HFU for Arotic surgery.  Patient complains of dysphagia and losing 10+ lbs in a week.  Patient has taken medications today.

## 2015-05-01 NOTE — Patient Instructions (Signed)
I think her pain will gradually get better. It is only been 10 days since surgery. Your discomfort will slowly improve. Please concentrate on increasing her diet and calorie consumption.

## 2015-05-07 ENCOUNTER — Ambulatory Visit (INDEPENDENT_AMBULATORY_CARE_PROVIDER_SITE_OTHER): Payer: Self-pay | Admitting: Internal Medicine

## 2015-05-07 VITALS — BP 124/74 | HR 106 | Ht 69.0 in | Wt 120.2 lb

## 2015-05-07 DIAGNOSIS — Z79899 Other long term (current) drug therapy: Secondary | ICD-10-CM

## 2015-05-07 DIAGNOSIS — R634 Abnormal weight loss: Secondary | ICD-10-CM

## 2015-05-07 DIAGNOSIS — E46 Unspecified protein-calorie malnutrition: Secondary | ICD-10-CM

## 2015-05-07 DIAGNOSIS — I712 Thoracic aortic aneurysm, without rupture: Secondary | ICD-10-CM

## 2015-05-07 DIAGNOSIS — I7121 Aneurysm of the ascending aorta, without rupture: Secondary | ICD-10-CM

## 2015-05-07 DIAGNOSIS — I10 Essential (primary) hypertension: Secondary | ICD-10-CM

## 2015-05-07 DIAGNOSIS — I214 Non-ST elevation (NSTEMI) myocardial infarction: Secondary | ICD-10-CM

## 2015-05-07 DIAGNOSIS — I7100 Dissection of unspecified site of aorta: Secondary | ICD-10-CM

## 2015-05-07 DIAGNOSIS — R1310 Dysphagia, unspecified: Secondary | ICD-10-CM

## 2015-05-07 DIAGNOSIS — D509 Iron deficiency anemia, unspecified: Secondary | ICD-10-CM

## 2015-05-07 DIAGNOSIS — R6881 Early satiety: Secondary | ICD-10-CM

## 2015-05-07 DIAGNOSIS — R000 Tachycardia, unspecified: Secondary | ICD-10-CM

## 2015-05-07 LAB — COMPREHENSIVE METABOLIC PANEL
ALK PHOS: 81 U/L (ref 33–115)
ALT: 30 U/L — AB (ref 6–29)
AST: 16 U/L (ref 10–30)
Albumin: 3.6 g/dL (ref 3.6–5.1)
BUN: 15 mg/dL (ref 7–25)
CALCIUM: 8.7 mg/dL (ref 8.6–10.2)
CHLORIDE: 101 mmol/L (ref 98–110)
CO2: 22 mmol/L (ref 20–31)
Creat: 0.5 mg/dL (ref 0.50–1.10)
Glucose, Bld: 103 mg/dL — ABNORMAL HIGH (ref 65–99)
POTASSIUM: 4.5 mmol/L (ref 3.5–5.3)
Sodium: 134 mmol/L — ABNORMAL LOW (ref 135–146)
TOTAL PROTEIN: 6.6 g/dL (ref 6.1–8.1)
Total Bilirubin: 0.6 mg/dL (ref 0.2–1.2)

## 2015-05-07 LAB — CBC
HCT: 31.9 % — ABNORMAL LOW (ref 35.0–45.0)
HEMOGLOBIN: 9.9 g/dL — AB (ref 11.7–15.5)
MCH: 27.8 pg (ref 27.0–33.0)
MCHC: 31 g/dL — ABNORMAL LOW (ref 32.0–36.0)
MCV: 89.6 fL (ref 80.0–100.0)
MPV: 9.5 fL (ref 7.5–12.5)
Platelets: 480 10*3/uL — ABNORMAL HIGH (ref 140–400)
RBC: 3.56 MIL/uL — AB (ref 3.80–5.10)
RDW: 14.2 % (ref 11.0–15.0)
WBC: 7.9 10*3/uL (ref 3.8–10.8)

## 2015-05-07 MED ORDER — FOLIC ACID 1 MG PO TABS: 1.0000 mg | ORAL_TABLET | Freq: Every day | ORAL | Status: AC

## 2015-05-07 MED ORDER — TRAMADOL HCL 50 MG PO TABS: 50.0000 mg | ORAL_TABLET | ORAL | Status: AC | PRN

## 2015-05-07 NOTE — Patient Instructions (Addendum)
Medication Instructions:   STOP lasix Dr. Rennis GoldenHilty has refilled tramadol TAKE folic acid - prescription sent to pharmacy  START ensure   Labwork:  TODAY - first floor [address]   Testing/Procedures:  [facility]  Follow-Up:  ONE MONTH with Dr. Rennis GoldenHilty  Any Other Special Instructions Will Be Listed Below (If Applicable).

## 2015-05-08 ENCOUNTER — Telehealth: Payer: Self-pay | Admitting: Thoracic Surgery (Cardiothoracic Vascular Surgery)

## 2015-05-08 LAB — PREALBUMIN: PREALBUMIN: 13 mg/dL — AB (ref 17–34)

## 2015-05-09 ENCOUNTER — Telehealth: Payer: Self-pay | Admitting: Internal Medicine

## 2015-05-09 ENCOUNTER — Ambulatory Visit (HOSPITAL_COMMUNITY): Payer: MEDICAID

## 2015-05-09 ENCOUNTER — Encounter: Payer: Self-pay | Admitting: Internal Medicine

## 2015-05-09 DIAGNOSIS — R1310 Dysphagia, unspecified: Secondary | ICD-10-CM | POA: Insufficient documentation

## 2015-05-09 NOTE — Progress Notes (Signed)
OFFICE NOTE  Chief Complaint:  Pain, trouble swallowing  Primary Care Physician: No PCP Per Patient  HPI:  [name] [name] is a 35 y.o. female from [place] who I first met in the hospital after emergent surgery. She presented to the emergency department with complaints of chest pain which she felt was related to inhaling cleaning products. The pain was sudden onset of severe substernal chest pain radiating to the neck. 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 She was cleaning using Clorox bleach at the time and thought perhaps she had inhaled too much fumes from the bleach. She presented to the emergency department where she was found to have positive troponin but EKG was sinus rhythm and no acute ischemic changes. CT angiogram of the chest was performed to rule out pulmonary embolus. It was reported to be negative for pulmonary embolus. The scan did reveal a 5.2 x 4.8 centimeter aneurysm of the proximal aorta. According to the interpreting radiologist there was no sign of aortic dissection. The patient was admitted to the hospitalist service, started on intravenous heparin and placed on protocol for presumed acute coronary syndrome. Elective cardiothoracic surgical consultation for the following morning was requested because of the presence of aneurysmal the proximal aorta. The patient is married and recently moved from [place] to the [place] approximately 6 months ago. She does not speak any [place]. She was found to have an acute type A aortic dissection with aneurysmal enlargement of the aortic root and underwent emergent surgery by Dr. Roxy [name]. She was volume over loaded and diuresed. She had ABL anemia. She did not require a post op transfusion. She was put on Niferex and folic acid. Her last H and H was 8.5 and 26.2. She also had mild thrombocytopenia. Her last platelet count was 97,000. She was weaned off the insulin drip. The patient's HGA1C pre  op 5.4. She has a history of tobacco abuse. Smoking cessation counseling was done. After discharge she was seen by Dr. Phoebe [name] in the family medicine clinic. He felt that she "actually looked quite well". She reported one low-dose fever. She reported some incisional chest pain. This was obtained from her father who translated. A translator was not available. Follow-up was scheduled in 2 weeks.  [name] [name] presents today for cardiology follow-up. We obtained a phone translator named [name] [name] [phone] during the visit. This was very helpful in answering all her questions. [name] [name] noted that she is in some mild pain which was improved with ibuprofen. She said that when she took Tylenol it gave her headaches and she stopped that medicine. She also noted being very weak and fatigued. She said her appetite was decreased and that she had difficulty eating because of early satiety. Her weight has gone down from 137 at discharge to 120 pounds today. She has been on Lasix 40 mg daily and was scheduled to stop that tomorrow. She's been taking over-the-counter iron. She reports that she get some pain relief with tramadol but is almost out of the medication and is interested in a refill. She denies any worsening shortness of breath or anginal chest pain. She reports occasionally feels like her heart speeds up. She does have some lightheadedness. We had a long discussion today about the possible causes of her aneurysm and the fact that given her body habitus she is at high risk for a connective tissue disorder such as Marfan's. She reports having a brother and  sister which should also get genetic testing and I would recommend referring her to an academic [facility] such as [facility] for further testing.  PMHx:  Past Medical History  Diagnosis Date  . Tobacco abuse   . Aortic aneurysm with dissection ([place]) 04/20/2015  . Essential hypertension   . Anemia   . S/P repair of acute aortic dissection and  valve-sparing root replacement with aortic valve repair 04/20/2015    26 mm Hemashield graft replacement of ascending thoracic aorta with hemi-arch distal anastamosis and 26 mm Gelweave Valsava aortic root graft for David Procedure valve-sparing aortic root replacement and reimplantation of left main and right coronary arteries     Past Surgical History  Procedure Laterality Date  . Ascending aortic root replacement N/A 04/20/2015    Procedure: VALVE-SPARING AORTIC ROOT REPLACEMENT USING A 26MM GELWEAVE VALSALVA GRAFT AND REIMPLANTATION OF RIGHT AND LEFT CORONARY ARTERIES ;  Surgeon: Rexene Alberts, MD;  Location: [place];  Service: Open Heart Surgery;  Laterality: N/A;  . Thoracic aortic aneurysm repair  04/20/2015    Procedure: REPAIR OF AORTIC DISSECTION WITH A 26MM HEMASHIELD GRAFT;  Surgeon: Rexene Alberts, MD;  Location: [facility] OR;  Service: Open Heart Surgery;;    FAMHx:  Family History  Problem Relation Age of Onset  . Aortic aneurysm Mother   . Coronary artery disease Mother   . Thyroid disease Mother   . Varicose Veins Mother     SOCHx:   reports that she has been smoking Cigarettes.  She has a 2 pack-year smoking history. She does not have any smokeless tobacco history on file. She reports that she does not drink alcohol. Her drug history is not on file.  ALLERGIES:  No Known Allergies  ROS: Pertinent items noted in HPI and remainder of comprehensive ROS otherwise negative.  HOME MEDS: Current Outpatient Prescriptions  Medication Sig Dispense Refill  . aspirin EC 81 MG EC tablet Take 1 tablet (81 mg total) by mouth daily.    . ferrous sulfate 325 (65 FE) MG EC tablet Take 325 mg by mouth daily with breakfast.    . folic acid (FOLVITE) 1 MG tablet Take 1 tablet (1 mg total) by mouth daily. 30 tablet 6  . ibuprofen (ADVIL,MOTRIN) 200 MG tablet Take 400 mg by mouth daily as needed.    . metoprolol tartrate (LOPRESSOR) 25 MG tablet Take 1 tablet (25 mg total) by mouth 3 (three)  times daily. 90 tablet 1  . traMADol (ULTRAM) 50 MG tablet Take 1-2 tablets (50-100 mg total) by mouth every 4 (four) hours as needed for moderate pain. 50 tablet 0   No current facility-administered medications for this visit.    LABS/IMAGING: No results found for this or any previous visit (from the past 48 hour(s)). No results found.  WEIGHTS: Wt Readings from Last 3 Encounters:  05/07/15 120 lb 3.2 oz (54.522 kg)  05/01/15 126 lb (57.153 kg)  04/25/15 150 lb 5.7 oz (68.2 kg)    VITALS: BP 124/74 mmHg  Pulse 106  Ht 5' 9"  (1.753 m)  Wt 120 lb 3.2 oz (54.522 kg)  BMI 17.74 kg/m2  EXAM: General appearance: alert, no distress and thin Neck: no carotid bruit and no JVD Lungs: clear to auscultation bilaterally and healing midline sternotomy scar Heart: regular rate and rhythm, S1, S2 normal, no murmur, click, rub or gallop Abdomen: soft, non-tender; bowel sounds normal; no masses,  no organomegaly Extremities: extremities normal, atraumatic, no cyanosis or edema Pulses:  2+ and symmetric Skin: pale, warm, dry Neurologic: Grossly normal Psych: Pleasant  EKG: Sinus tachycardia 106, mild anterolateral ST segment depression less than 1 mm with a biphasic T-wave  ASSESSMENT: 1. Status post Type A aortic dissection repair with reimplantation of the L/R coronaries 2. NSTEMI 3. Anemia 4. Dysphasia 5. Possible dehydration  PLAN: 1.   [name] [name] presents today for follow-up of her hospitalization. She reports she still in some discomfort after surgery. She gets some mild relief with ibuprofen I'm happy to provide her with some additional tramadol today for pain. Most of her symptoms are around the incision site. She does report some decreased appetite and difficulty swallowing. She says she gets full very quickly. I'm concerned about esophageal obstruction related to her surgery and would recommend a water-soluble esophagram. Especially concerning is her weight loss which is more  than 17 pounds since hospital discharge. She also has been diuresed on Lasix 40 mg daily. It's possible she could be dehydrated. I like to get lab work including a CBC and a compress and metabolic profile. I will also check a pre-albumin to confirm that she may be nutritionally depleted. I've advised future genetic testing for her and her siblings with regards to possible Marfan's syndrome. I recommended that she try to drink ensure because it may provide her protein and calories that she could swallow more easily. I'll plan to see her back in one month for follow-up of her studies and contact her when we have results available.  Pixie Casino, MD, [facility] Attending Cardiologist [place]

## 2015-05-09 NOTE — Telephone Encounter (Signed)
I contacted the patient's father but was unable to reach him to offer my condolences for her death. I discussed the case with Dr. Cornelius Moraswen. I agree that there are a number of possible acute causes for her death including pulmonary embolus, aortic rupture, acute MI or arrhythmia. I received her lab work back today which demonstrated an improvement in her anemia but otherwise no significant or outstanding findings in her comprehensive metabolic profile and CBC. Cause of death was unknown and I completed a death certificate.  [name] NoseKenneth C. Hilty, MD, [facility] Attending Cardiologist [facility]

## 2015-05-09 NOTE — Telephone Encounter (Signed)
4.5.17 [employer] dropped off death certificate for Dr Rennis GoldenHilty to sign.  Gave Certificate to [place], RN for Dr Rennis GoldenHilty to sign on 05/09/15.    4.6.17 Received signed Death Certificate back from Dr Rennis GoldenHilty.  Contacted funeral home and advised Death Certificate ready for pick up. lp

## 2015-05-14 ENCOUNTER — Ambulatory Visit: Payer: Self-pay | Admitting: Internal Medicine

## 2015-05-20 ENCOUNTER — Ambulatory Visit: Payer: Self-pay | Admitting: Thoracic Surgery (Cardiothoracic Vascular Surgery)

## 2015-05-20 ENCOUNTER — Ambulatory Visit (HOSPITAL_COMMUNITY): Payer: MEDICAID

## 2015-06-03 NOTE — Telephone Encounter (Signed)
I called to speak with the patient's husband and father through an interpreter.  I expressed my Condolences regarding the patient's death. We discussed the fact that it is impossible to know for certain what happened. We discussed the possibility of obtaining an autopsy but the patient does not have funds available to pay for the costs. They would be very much supportive in obtaining an autopsy if they were not required to pay the bill. We discussed a variety of possible explanations for the patient's sudden death including propagation or rupture of the patient's remaining dissected aorta, pericardial tamponade, pulmonary embolus, or acute myocardial infarction. All of their questions were answered.  Purcell Nailslarence H Owen, MD 05/22/2015 6:48 PM

## 2015-06-03 DEATH — deceased

## 2015-06-07 ENCOUNTER — Ambulatory Visit: Payer: Self-pay | Admitting: Internal Medicine

## 2017-12-31 IMAGING — CT CT ANGIO CHEST
3 of 15 series · 16 of 46 positions shown · IV contrast (OMNI)
Comparison: 04/20/2015 and 04/19/2015

CLINICAL DATA: Acute pain today at incision site. Post aortic
dissection repair. Valve sparing aortic root replacement with with
graft and reimplantation of right and left coronary arteries.

EXAM:
CT ANGIOGRAPHY CHEST AND ABDOMEN
TECHNIQUE: Multidetector CT imaging of the chest and abdomen was performed
using the standard protocol during bolus administration of
intravenous contrast. Multiplanar CT image reconstructions and MIPs
were obtained to evaluate the vascular anatomy.
CONTRAST:  100mL OMNIPAQUE IOHEXOL 350 MG/ML SOLN

[Series 6: dissection 3.0 i30f 3 · axial · 0.66mm/px · z∈[+985,+1393]mm · 12 of 162 slices shown]
[im 13/162  lung]
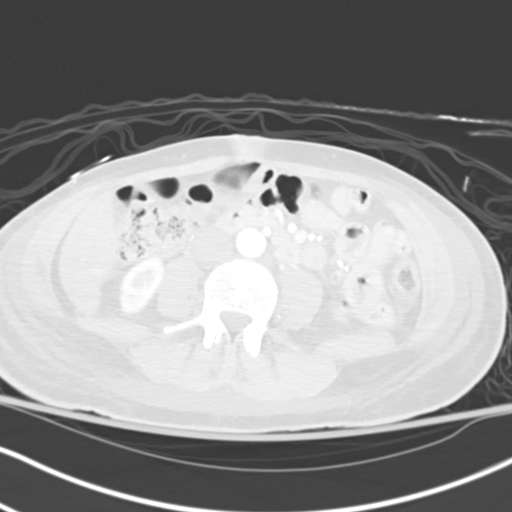
[im 25/162  soft-tissue]
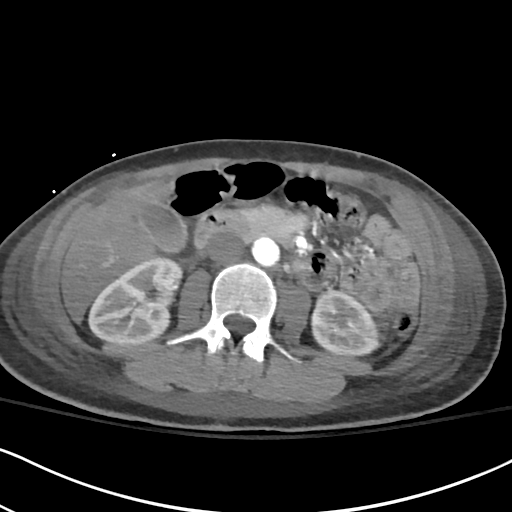
[im 38/162  lung]
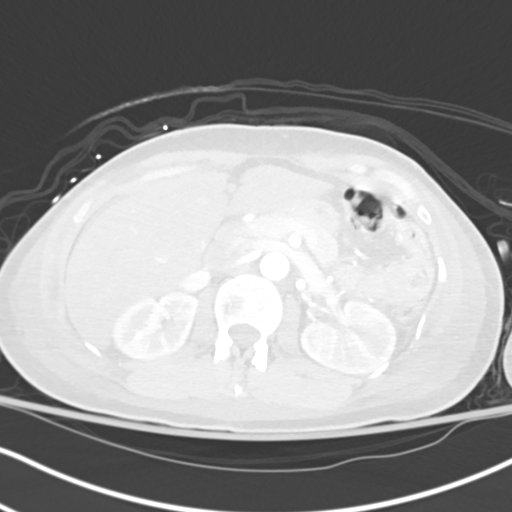
[im 50/162  soft-tissue]
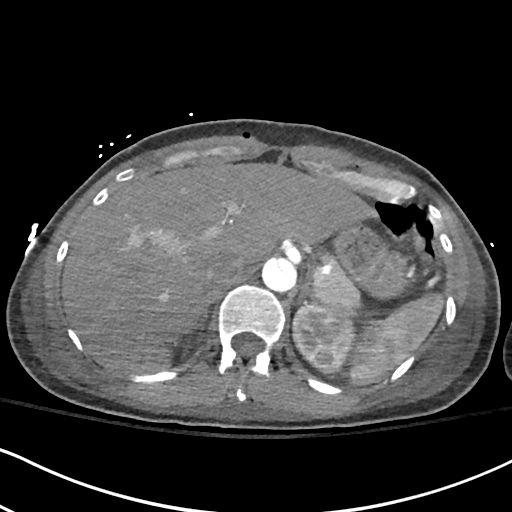
[im 62/162  lung]
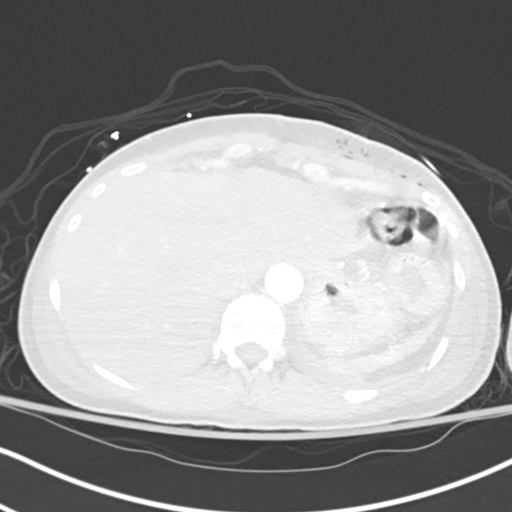
[im 75/162  soft-tissue]
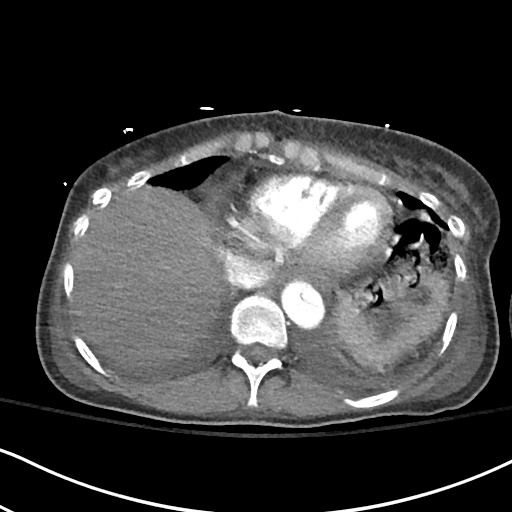
[im 87/162  lung]
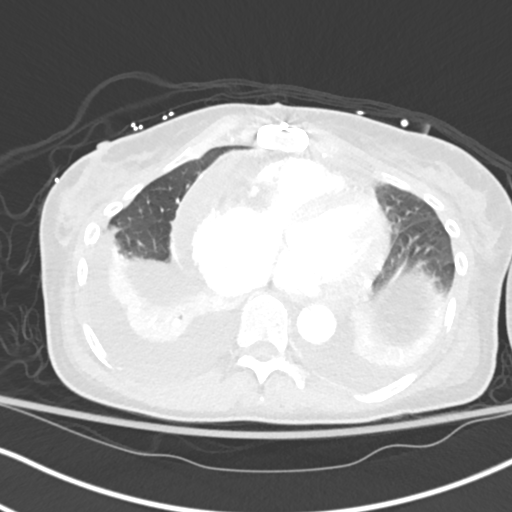
[im 100/162  soft-tissue]
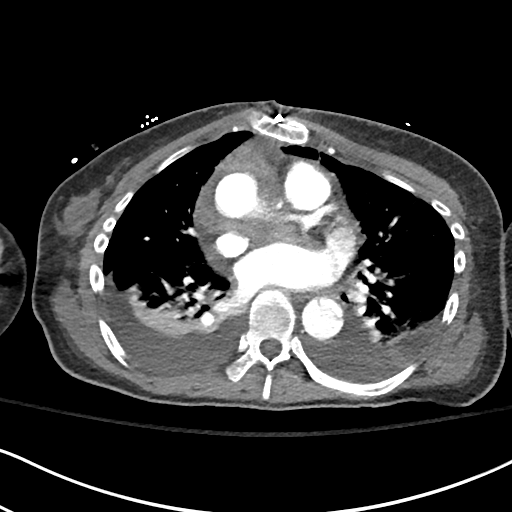
[im 112/162  lung]
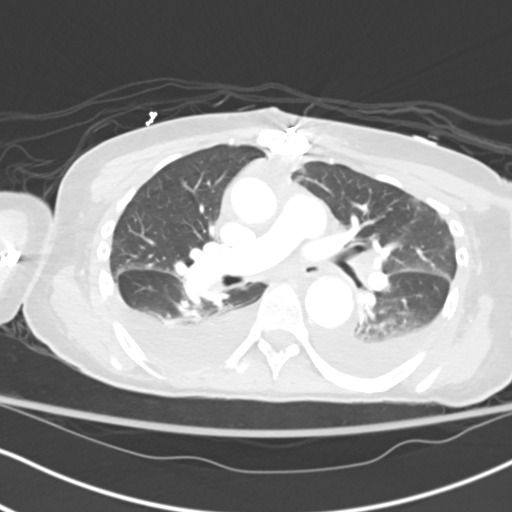
[im 124/162  soft-tissue]
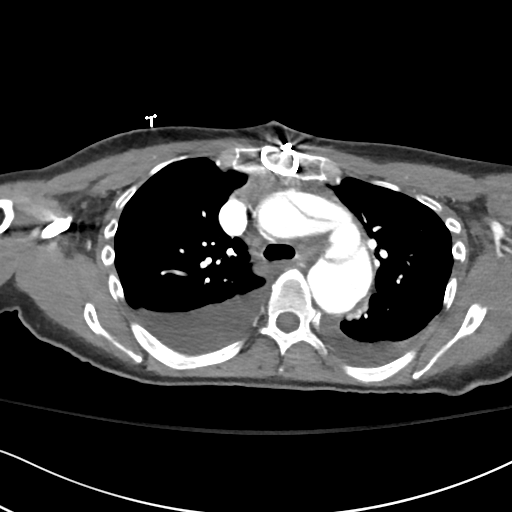
[im 137/162  lung]
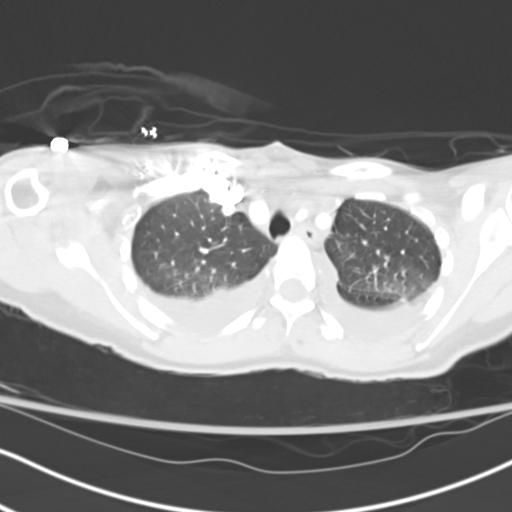
[im 149/162  soft-tissue]
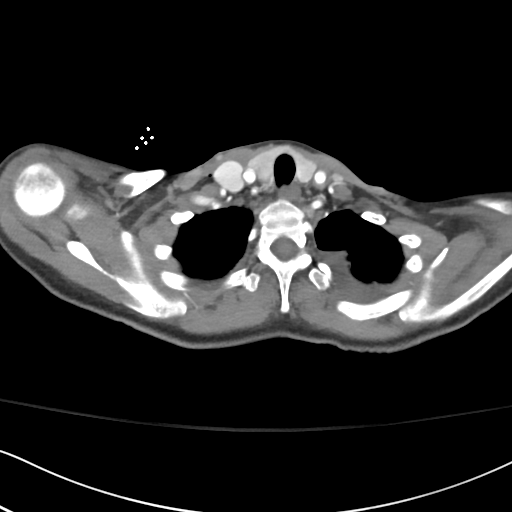

[Series 7: lung · axial · 0.66mm/px · z∈[+1165,+1240]mm · 3 of 102 slices shown]
[im 13/102  soft-tissue]
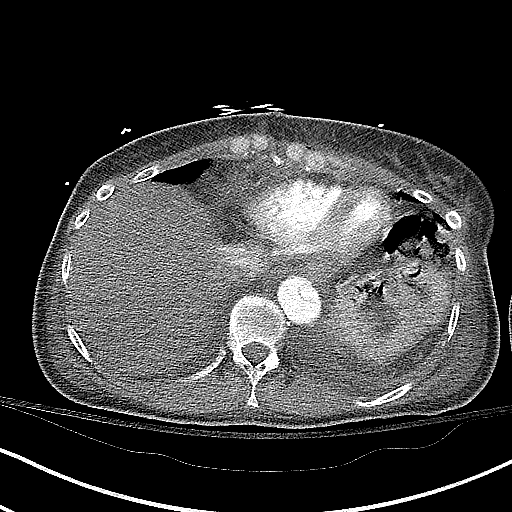
[im 26/102  soft-tissue]
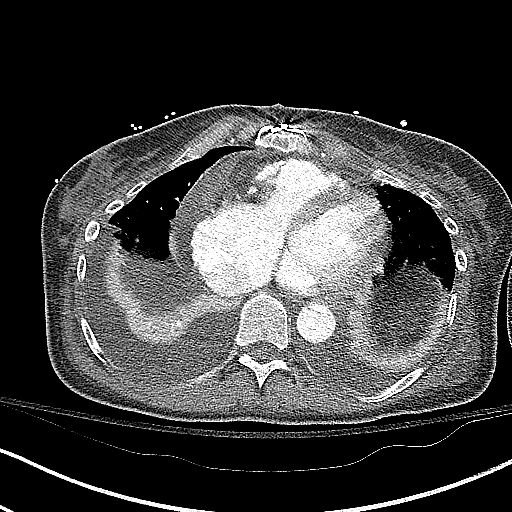
[im 38/102  soft-tissue]
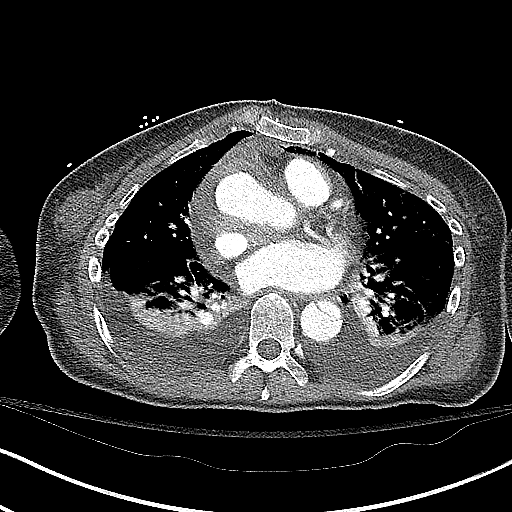

[Series 9: coronals · coronal · 0.66mm/px · 1 of 102 slices shown]
[im 51/102  soft-tissue]
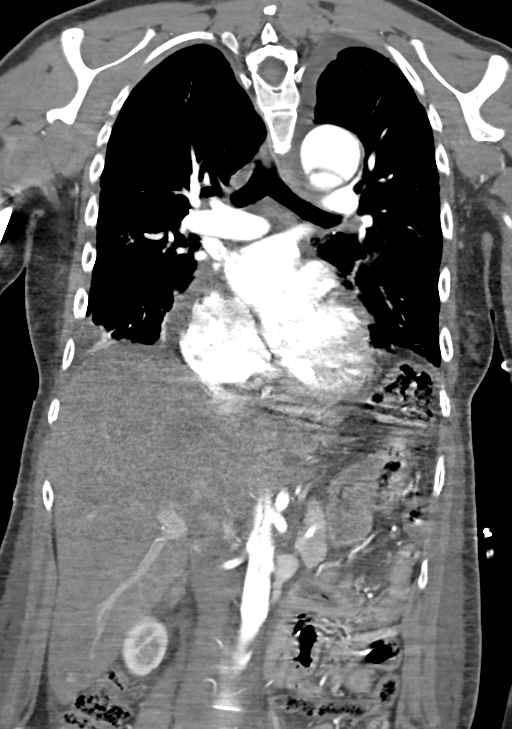

[16 of 46 positions shown; findings below may reference images not displayed]

FINDINGS: CTA CHEST FINDINGS

Median sternotomy wires are intact. Minimal subcutaneous air over
the left lower anterior chest and upper chest/neck base. There is a
small left apical pneumothorax small to moderate bilateral pleural
fluid collections right slightly greater than left which are worse.
There is associated bibasilar atelectasis and mild dependent density
over the posterior left upper lobe likely atelectasis. Airways are
within normal.

Mild stable cardiomegaly. Small amount of pericardial fluid over the
right heart border slightly more pronounced.

Evidence of patient's repair of type A aortic dissection as the
opacified aortic root, sinotubular junction and ascending aorta are
normal in caliber without evidence of dissection flap likely
excluded by graft. Dissection flap again seen at the level of the
aortic arch and continues throughout the descending thoracic aorta
as the true lumen is smaller than the false lumen and unchanged from
the prior exam. Stable to slightly less mediastinal
fluid/hemorrhage. The dissection flap extends into the great vessels
only involving the origin/proximal segment of the left subclavian
and right brachiocephalic arteries. Dissection extends into the left
common carotid artery as the previously narrowed true lumen is now
normal in caliber with thrombus within the false lumen to the level
of the thyroid gland. Remaining mediastinal structures are
unchanged.

1.5 cm left thyroid nodule unchanged.

Review of the MIP images confirms the above findings.

CTA ABDOMEN FINDINGS

Dissection flap continues into the abdominal aorta as true lumen
remains smaller than the false lumen. The flap extends to the
bifurcation involving the origin of the visualize common iliac
arteries. Abdominal aorta is otherwise normal in caliber. Celiac
axis and superior mesenteric artery arise from the true lumen as
does the inferior mesenteric artery which are normally opacified.
Single bilateral renal arteries arise normally from the anteriorly
located false lumen as there is normal opacification of the kidneys.

Couple sub cm hyperdense foci over the inferior right lobe of the
liver on these arterial phase images likely arterial venous
shunting. Remainder of the liver, gallbladder, spleen, pancreas and
adrenal glands are normal. Stomach is normal.

Kidneys are within normal.

Visualized bowel and mesentery are within normal.

No free fluid or free peritoneal air. Minimal subcutaneous air over
the left upper abdomen. Mild diffuse subcutaneous edema.

Remaining bony structures are unremarkable.

Review of the MIP images confirms the above findings.
IMPRESSION: Evidence of recent graft repair of type A aortic dissection as
described with graft exclusion of the flap involving the
Mubeen junction and ascending aorta. No significant overall
change in small amount of mediastinal/pericardial fluid/hemorrhage.
Dissection flap again seen beginning at the aortic arch extending
distally into the abdominal aorta to the aortic bifurcation and
visualized common iliac arteries as this is stable. Dissection
extends into the great vessels as described as there has been
interval improvement with normal opacified caliber of the true lumen
of the left common carotid artery. No solid organ perfusion
abnormalities within the abdomen as solid organs opacified from the
smaller anteriorly located true lumen.

Small left apical pneumothorax.

Small to moderate bilateral pleural effusions right greater than
left with associated bibasilar and posterior left upper lobe
atelectasis. Stable cardiomegaly.

Stable 1.5 cm left thyroid nodule. Recommend further evaluation with
thyroid ultrasound on an elective basis.
